# Patient Record
Sex: Male | Born: 1960 | Smoking: Former smoker
Health system: Southern US, Community
[De-identification: ages and names within clinical notes are randomized; demographics above are authoritative.]

## PROBLEM LIST (undated history)

## (undated) ENCOUNTER — Ambulatory Visit: Admission: EM | Discharge status: Absent without leave

## (undated) DIAGNOSIS — M545 Low back pain, unspecified: Secondary | ICD-10-CM

## (undated) DIAGNOSIS — H5789 Other specified disorders of eye and adnexa: Secondary | ICD-10-CM

## (undated) DIAGNOSIS — I10 Essential (primary) hypertension: Secondary | ICD-10-CM

## (undated) DIAGNOSIS — E785 Hyperlipidemia, unspecified: Secondary | ICD-10-CM

## (undated) HISTORY — DX: Essential (primary) hypertension: I10

## (undated) HISTORY — DX: Low back pain: M54.5

## (undated) HISTORY — DX: Other specified disorders of eye and adnexa: H57.89

## (undated) HISTORY — DX: Hyperlipidemia, unspecified: E78.5

## (undated) HISTORY — DX: Low back pain, unspecified: M54.50

---

## 2010-01-07 HISTORY — PX: OTHER SURGICAL HISTORY: SHX169

## 2010-09-05 LAB — HM COLONOSCOPY

## 2013-01-07 HISTORY — PX: INGUINAL HERNIA REPAIR: SHX194

## 2015-09-05 ENCOUNTER — Encounter: Payer: Self-pay | Admitting: Internal Medicine

## 2015-09-05 ENCOUNTER — Ambulatory Visit (INDEPENDENT_AMBULATORY_CARE_PROVIDER_SITE_OTHER): Admitting: Internal Medicine

## 2015-09-05 VITALS — BP 140/84 | HR 84 | Resp 16 | Ht 69.0 in | Wt 167.0 lb

## 2015-09-05 DIAGNOSIS — G43009 Migraine without aura, not intractable, without status migrainosus: Secondary | ICD-10-CM | POA: Diagnosis not present

## 2015-09-05 DIAGNOSIS — M4806 Spinal stenosis, lumbar region: Secondary | ICD-10-CM

## 2015-09-05 DIAGNOSIS — K219 Gastro-esophageal reflux disease without esophagitis: Secondary | ICD-10-CM | POA: Insufficient documentation

## 2015-09-05 DIAGNOSIS — H578 Other specified disorders of eye and adnexa: Secondary | ICD-10-CM

## 2015-09-05 DIAGNOSIS — I10 Essential (primary) hypertension: Secondary | ICD-10-CM | POA: Insufficient documentation

## 2015-09-05 DIAGNOSIS — M48061 Spinal stenosis, lumbar region without neurogenic claudication: Secondary | ICD-10-CM | POA: Insufficient documentation

## 2015-09-05 DIAGNOSIS — H5789 Other specified disorders of eye and adnexa: Secondary | ICD-10-CM

## 2015-09-05 DIAGNOSIS — Z800 Family history of malignant neoplasm of digestive organs: Secondary | ICD-10-CM | POA: Diagnosis not present

## 2015-09-05 HISTORY — DX: Other specified disorders of eye and adnexa: H57.89

## 2015-09-05 NOTE — Progress Notes (Signed)
Date:  09/05/2015   Name:  Robert Gilmore   DOB:  Feb 09, 1960   MRN:  [id number]  Previously being seen in [place], [place].  He was getting scripts from an UC/Primary care office.  They have referred him to [facility] clinic with an appointment in September.  Chief Complaint: Establish Care Hypertension  This is a chronic problem. The current episode started more than 1 year ago. The problem is unchanged. The problem is controlled. Associated symptoms include headaches. Pertinent negatives include no chest pain, neck pain, palpitations or shortness of breath. Past treatments include ACE inhibitors and diuretics. The current treatment provides significant improvement. 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  Back Pain  This is a chronic problem. The current episode started more than 1 year ago (since 2003 - no injury). The problem occurs constantly. The problem has been gradually worsening since onset. The pain is present in the lumbar spine. The quality of the pain is described as aching and burning. The pain radiates to the right knee and left knee. The pain is moderate. Associated symptoms include headaches, numbness and weakness. Pertinent negatives include no abdominal pain, chest pain or dysuria. He has tried NSAIDs and analgesics (currently taking Ultram ER) for the symptoms. The treatment provided moderate (but he has to take it every day) relief.  Migraine   This is a recurrent problem. The current episode started more than 1 year ago. The problem occurs seasonly (about twice a year). The pain is located in the right unilateral region. The quality of the pain is described as shooting and boring. Associated symptoms include back pain, eye redness (every AM), numbness, phonophobia, photophobia and weakness. Pertinent negatives include no abdominal pain, coughing, dizziness, hearing loss or neck pain. The symptoms are aggravated by emotional stress. Improvement on treatment: seen  by neurologist - prescribed PRN medications. His past medical history is significant for hypertension.  Gastroesophageal Reflux  He complains of heartburn and a hoarse voice. He reports no abdominal pain, no chest pain, no coughing or no wheezing. The problem occurs occasionally. Pertinent negatives include no fatigue. He has tried a PPI for the symptoms.  Red eyes - every am has red eyes with mild throbbing but no discharge, no change in vision.  Improves with Visine drops or as the day progresses.  He denies sleeping with a fan, new animal or other potential allergen source.  Seen by Ophthalmology in March with normal exam but did not mention this.    Review of Systems  Constitutional: Negative for appetite change, fatigue and unexpected weight change.  HENT: Positive for hoarse voice. Negative for hearing loss.   Eyes: Positive for photophobia and redness (every AM). Negative for visual disturbance.  Respiratory: Negative for cough, shortness of breath and wheezing.   Cardiovascular: Negative for chest pain, palpitations and leg swelling.  Gastrointestinal: Positive for heartburn. Negative for abdominal pain and blood in stool.  Endocrine: Negative for polydipsia and polyuria.  Genitourinary: Negative for difficulty urinating, dysuria and hematuria.  Musculoskeletal: Positive for arthralgias, back pain and myalgias. Negative for neck pain and neck stiffness.  Skin: Negative for color change and rash.  Neurological: Positive for weakness, numbness and headaches. Negative for dizziness and tremors.  Psychiatric/Behavioral: Positive for decreased concentration and sleep disturbance. Negative for dysphoric mood.    Patient Active Problem List   Diagnosis Date Noted  . Essential hypertension 09/05/2015  . Spinal stenosis at L4-L5 level 09/05/2015    Prior to Admission  medications   Medication Sig Start Date End Date Taking? Authorizing Provider  ALPRAZolam Duanne Moron) 0.5 MG tablet Take 0.5  mg by mouth at bedtime as needed for anxiety.   Yes Historical Provider, MD  lisinopril (PRINIVIL,ZESTRIL) 40 MG tablet Take 40 mg by mouth daily.   Yes Historical Provider, MD  traMADol (ULTRAM-ER) 200 MG 24 hr tablet Take 200 mg by mouth daily.   Yes Historical Provider, MD  traZODone (DESYREL) 50 MG tablet  06/13/15  Yes Historical Provider, MD  triamterene-hydrochlorothiazide (MAXZIDE) 75-50 MG tablet  06/20/15  Yes Historical Provider, MD    No Known Allergies  Past Surgical History:  Procedure Laterality Date  . colonoscopy  2012    Social History  Substance Use Topics  . Smoking status: Former Research scientist (life sciences)  . Smokeless tobacco: Never Used  . Alcohol use Not on file     Medication list has been reviewed and updated.   Physical Exam  Constitutional: He is oriented to person, place, and time. He appears well-developed. No distress.  HENT:  Head: Normocephalic and atraumatic.  Eyes: Conjunctivae and EOM are normal. Pupils are equal, round, and reactive to light. Right eye exhibits no chemosis. Left eye exhibits no chemosis.  Neck: Normal range of motion. Neck supple. Carotid bruit is not present.  Cardiovascular: Normal rate, regular rhythm and normal heart sounds.   Pulmonary/Chest: Effort normal and breath sounds normal. No respiratory distress. He has no wheezes. He has no rales.  Musculoskeletal: Normal range of motion. He exhibits no edema or tenderness.       Lumbar back: He exhibits normal range of motion, no tenderness and no bony tenderness.  Neurological: He is alert and oriented to person, place, and time. He has normal strength. No sensory deficit.  Reflex Scores:      Bicep reflexes are 1+ on the right side and 1+ on the left side.      Patellar reflexes are 1+ on the right side and 1+ on the left side. Skin: Skin is warm, dry and intact. No rash noted.  Psychiatric: He has a normal mood and affect. His speech is normal and behavior is normal. Thought content normal.    Nursing note and vitals reviewed.   BP 140/84 (BP Location: Right Arm, Patient Position: Sitting, Cuff Size: Normal)   Pulse 84   Resp 16   Ht 5\' 9"  (1.753 m)   Wt 167 lb (75.8 kg)   SpO2 97%   BMI 24.66 kg/m   Assessment and Plan: 1. Essential hypertension Controlled Schedule CPX in the near future  2. Spinal stenosis at L4-L5 level With chronic pain controlled fairly well with Ultram See [facility] pain clinic as planned  3. Gastroesophageal reflux disease without esophagitis Controlled by Nexium  4. Migraine without aura and without status migrainosus, not intractable Occasional episodes - has PRN medications to take  5. Red eyes Try allegra daily or follow up with Ophthalmology   Halina Maidens, MD [facility]  09/05/2015

## 2015-09-05 NOTE — Patient Instructions (Signed)
DASH Eating Plan  DASH stands for "Dietary Approaches to Stop Hypertension." The DASH eating plan is a healthy eating plan that has been shown to reduce high blood pressure (hypertension). Additional health benefits may include reducing the risk of type 2 diabetes mellitus, heart disease, and stroke. The DASH eating plan may also help with weight loss.  WHAT DO I NEED TO KNOW ABOUT THE DASH EATING PLAN?  For the DASH eating plan, you will follow these general guidelines:  · Choose foods with a percent daily value for sodium of less than 5% (as listed on the food label).  · Use salt-free seasonings or herbs instead of table salt or sea salt.  · Check with your health care provider or pharmacist before using salt substitutes.  · Eat lower-sodium products, often labeled as "lower sodium" or "no salt added."  · Eat fresh foods.  · Eat more vegetables, fruits, and low-fat dairy products.  · Choose whole grains. Look for the word "whole" as the first word in the ingredient list.  · Choose fish and skinless chicken or turkey more often than red meat. Limit fish, poultry, and meat to 6 oz (170 g) each day.  · Limit sweets, desserts, sugars, and sugary drinks.  · Choose heart-healthy fats.  · Limit cheese to 1 oz (28 g) per day.  · Eat more home-cooked food and less restaurant, buffet, and fast food.  · Limit fried foods.  · Cook foods using methods other than frying.  · Limit canned vegetables. If you do use them, rinse them well to decrease the sodium.  · When eating at a restaurant, ask that your food be prepared with less salt, or no salt if possible.  WHAT FOODS CAN I EAT?  Seek help from a dietitian for individual calorie needs.  Grains  Whole grain or whole wheat bread. Brown rice. Whole grain or whole wheat pasta. Quinoa, bulgur, and whole grain cereals. Low-sodium cereals. Corn or whole wheat flour tortillas. Whole grain cornbread. Whole grain crackers. Low-sodium crackers.  Vegetables  Fresh or frozen vegetables  (raw, steamed, roasted, or grilled). Low-sodium or reduced-sodium tomato and vegetable juices. Low-sodium or reduced-sodium tomato sauce and paste. Low-sodium or reduced-sodium canned vegetables.   Fruits  All fresh, canned (in natural juice), or frozen fruits.  Meat and Other Protein Products  Ground beef (85% or leaner), grass-fed beef, or beef trimmed of fat. Skinless chicken or turkey. Ground chicken or turkey. Pork trimmed of fat. All fish and seafood. Eggs. Dried beans, peas, or lentils. Unsalted nuts and seeds. Unsalted canned beans.  Dairy  Low-fat dairy products, such as skim or 1% milk, 2% or reduced-fat cheeses, low-fat ricotta or cottage cheese, or plain low-fat yogurt. Low-sodium or reduced-sodium cheeses.  Fats and Oils  Tub margarines without trans fats. Light or reduced-fat mayonnaise and salad dressings (reduced sodium). Avocado. Safflower, olive, or canola oils. Natural peanut or almond butter.  Other  Unsalted popcorn and pretzels.  The items listed above may not be a complete list of recommended foods or beverages. Contact your dietitian for more options.  WHAT FOODS ARE NOT RECOMMENDED?  Grains  White bread. White pasta. White rice. Refined cornbread. Bagels and croissants. Crackers that contain trans fat.  Vegetables  Creamed or fried vegetables. Vegetables in a cheese sauce. Regular canned vegetables. Regular canned tomato sauce and paste. Regular tomato and vegetable juices.  Fruits  Dried fruits. Canned fruit in light or heavy syrup. Fruit juice.  Meat and Other Protein   Products  Fatty cuts of meat. Ribs, chicken wings, bacon, sausage, bologna, salami, chitterlings, fatback, hot dogs, bratwurst, and packaged luncheon meats. Salted nuts and seeds. Canned beans with salt.  Dairy  Whole or 2% milk, cream, half-and-half, and cream cheese. Whole-fat or sweetened yogurt. Full-fat cheeses or blue cheese. Nondairy creamers and whipped toppings. Processed cheese, cheese spreads, or cheese  curds.  Condiments  Onion and garlic salt, seasoned salt, table salt, and sea salt. Canned and packaged gravies. Worcestershire sauce. Tartar sauce. Barbecue sauce. Teriyaki sauce. Soy sauce, including reduced sodium. Steak sauce. Fish sauce. Oyster sauce. Cocktail sauce. Horseradish. Ketchup and mustard. Meat flavorings and tenderizers. Bouillon cubes. Hot sauce. Tabasco sauce. Marinades. Taco seasonings. Relishes.  Fats and Oils  Butter, stick margarine, lard, shortening, ghee, and bacon fat. Coconut, palm kernel, or palm oils. Regular salad dressings.  Other  Pickles and olives. Salted popcorn and pretzels.  The items listed above may not be a complete list of foods and beverages to avoid. Contact your dietitian for more information.  WHERE CAN I FIND MORE INFORMATION?  National Heart, Lung, and Blood Institute: www.nhlbi.nih.gov/health/health-topics/topics/dash/     This information is not intended to replace advice given to you by your health care provider. Make sure you discuss any questions you have with your health care provider.     Document Released: 12/13/2010 Document Revised: 01/14/2014 Document Reviewed: 10/28/2012  Elsevier Interactive Patient Education ©2016 Elsevier Inc.

## 2015-10-03 ENCOUNTER — Encounter: Payer: Self-pay | Admitting: Internal Medicine

## 2015-10-03 ENCOUNTER — Ambulatory Visit
Admission: RE | Admit: 2015-10-03 | Discharge: 2015-10-03 | Disposition: A | Discharge status: Home / Self Care | Source: Ambulatory Visit | Attending: Internal Medicine | Admitting: Internal Medicine

## 2015-10-03 ENCOUNTER — Ambulatory Visit (INDEPENDENT_AMBULATORY_CARE_PROVIDER_SITE_OTHER): Admitting: Internal Medicine

## 2015-10-03 VITALS — BP 136/82 | HR 83 | Resp 16 | Ht 69.0 in | Wt 173.0 lb

## 2015-10-03 DIAGNOSIS — G2581 Restless legs syndrome: Secondary | ICD-10-CM

## 2015-10-03 DIAGNOSIS — Z0000 Encounter for general adult medical examination without abnormal findings: Secondary | ICD-10-CM

## 2015-10-03 DIAGNOSIS — M4186 Other forms of scoliosis, lumbar region: Secondary | ICD-10-CM | POA: Insufficient documentation

## 2015-10-03 DIAGNOSIS — M4806 Spinal stenosis, lumbar region: Secondary | ICD-10-CM

## 2015-10-03 DIAGNOSIS — I10 Essential (primary) hypertension: Secondary | ICD-10-CM | POA: Diagnosis not present

## 2015-10-03 DIAGNOSIS — M5136 Other intervertebral disc degeneration, lumbar region: Secondary | ICD-10-CM | POA: Diagnosis not present

## 2015-10-03 DIAGNOSIS — R0781 Pleurodynia: Secondary | ICD-10-CM | POA: Diagnosis not present

## 2015-10-03 DIAGNOSIS — K219 Gastro-esophageal reflux disease without esophagitis: Secondary | ICD-10-CM | POA: Diagnosis not present

## 2015-10-03 DIAGNOSIS — M48061 Spinal stenosis, lumbar region without neurogenic claudication: Secondary | ICD-10-CM

## 2015-10-03 DIAGNOSIS — Z125 Encounter for screening for malignant neoplasm of prostate: Secondary | ICD-10-CM | POA: Diagnosis not present

## 2015-10-03 LAB — POCT URINALYSIS DIPSTICK
BILIRUBIN UA: NEGATIVE
Blood, UA: NEGATIVE
Glucose, UA: NEGATIVE
Ketones, UA: NEGATIVE
LEUKOCYTES UA: NEGATIVE
NITRITE UA: NEGATIVE
PH UA: 6
PROTEIN UA: NEGATIVE
Spec Grav, UA: 1.02
Urobilinogen, UA: 0.2

## 2015-10-03 MED ORDER — CLONAZEPAM 0.5 MG PO TABS: 0.5000 mg | ORAL_TABLET | Freq: Every day | ORAL | 1 refills | Status: DC

## 2015-10-03 MED ORDER — CLONAZEPAM 0.5 MG PO TABS: 0.5000 mg | ORAL_TABLET | Freq: Every day | ORAL | 0 refills | Status: DC

## 2015-10-03 NOTE — Progress Notes (Signed)
Date:  10/03/2015   Name:  Robert Gilmore   DOB:  24-Mar-1960   MRN:  [id number]   Chief Complaint: Annual Exam; Rib Injury (Rib pain on Right side); and Insomnia Robert Gilmore is a 55 y.o. male who presents today for his Complete Annual Exam. He feels fairly well. He reports exercising regularly. He reports he is sleeping poorly.   Insomnia  Primary symptoms: sleep disturbance, no difficulty falling asleep, frequent awakening, premature morning awakening.  The onset quality is undetermined. The symptoms are aggravated by other stimulant use. Past treatments include medication (trazodone causing head stuffiness). The treatment provided mild relief. PMH includes: restless leg syndrome (has taken clonazepam in the past).  Hypertension  This is a chronic problem. The current episode started more than 1 year ago. The problem is unchanged. The problem is controlled. Pertinent negatives include no chest pain, headaches, palpitations or shortness of breath.  Back Pain  This is a chronic problem. The problem has been gradually worsening since onset. The pain is present in the lumbar spine. Pertinent negatives include no abdominal pain, chest pain, dysuria or headaches. He has tried analgesics, muscle relaxant and NSAIDs (currently seeing pain management at [place] next month) for the symptoms. The treatment provided moderate relief.   Rib pain - onset about 6 mo ago with no injury.  Pain in the lower ant right ribs - feels like his ribs are rubbing against his pelvic bone.  It hurts more when sitting.  Better with standing or lying down.  He tends to twist in his desk at work.  It improves as well with advil or prednisone (took a recent taper for his back pain).   Review of Systems  Constitutional: Negative for appetite change, chills, diaphoresis, fatigue and unexpected weight change.  HENT: Negative for hearing loss, tinnitus, trouble swallowing and voice change.   Eyes: Negative for visual  disturbance.  Respiratory: Negative for choking, shortness of breath and wheezing.   Cardiovascular: Negative for chest pain, palpitations and leg swelling.  Gastrointestinal: Negative for abdominal pain, blood in stool, constipation and diarrhea.  Endocrine: Negative for polydipsia and polyuria.  Genitourinary: Negative for difficulty urinating, dysuria and frequency.  Musculoskeletal: Positive for back pain. Negative for arthralgias and myalgias.  Skin: Negative for color change and rash.  Allergic/Immunologic: Negative for environmental allergies.  Neurological: Negative for dizziness, tremors, seizures, syncope and headaches.       Restless leg sx 4 nights per week  Hematological: Negative for adenopathy.  Psychiatric/Behavioral: Positive for sleep disturbance. Negative for dysphoric mood. The patient is nervous/anxious and has insomnia.     Patient Active Problem List   Diagnosis Date Noted  . Essential hypertension 09/05/2015  . Spinal stenosis at L4-L5 level 09/05/2015  . Family history of colon cancer 09/05/2015  . Gastroesophageal reflux disease without esophagitis 09/05/2015  . Migraine without aura and without status migrainosus, not intractable 09/05/2015  . Red eyes 09/05/2015    Prior to Admission medications   Medication Sig Start Date End Date Taking? Authorizing Provider  triamterene-hydrochlorothiazide (MAXZIDE) 75-50 MG tablet  06/20/15  Yes Historical Provider, MD  ALPRAZolam Duanne Moron) 0.5 MG tablet Take by mouth.    Historical Provider, MD  B Complex Vitamins (VITAMIN-B COMPLEX) TABS Take by mouth.    Historical Provider, MD  b complex vitamins tablet Take 1 tablet by mouth daily.    Historical Provider, MD  esomeprazole (NEXIUM) 40 MG capsule Take 40 mg by mouth daily at  12 noon.    Historical Provider, MD  esomeprazole (NEXIUM) 40 MG capsule Take by mouth.    Historical Provider, MD  lisinopril (PRINIVIL,ZESTRIL) 40 MG tablet Take 40 mg by mouth daily.     Historical Provider, MD  lisinopril (PRINIVIL,ZESTRIL) 40 MG tablet Take by mouth.    Historical Provider, MD  Multiple Vitamins-Minerals (MULTIVITAMIN WITH MINERALS) tablet Take 1 tablet by mouth daily.    Historical Provider, MD  Multiple Vitamins-Minerals (MULTIVITAMIN WITH MINERALS) tablet Take by mouth.    Historical Provider, MD  traMADol (ULTRAM-ER) 200 MG 24 hr tablet Take by mouth.    Historical Provider, MD  traZODone (DESYREL) 50 MG tablet  06/13/15   Historical Provider, MD  triamterene-hydrochlorothiazide (MAXZIDE) 75-50 MG tablet  06/20/15   Historical Provider, MD    No Known Allergies  Past Surgical History:  Procedure Laterality Date  . colonoscopy  2012    Social History  Substance Use Topics  . Smoking status: Former Research scientist (life sciences)  . Smokeless tobacco: Never Used  . Alcohol use Not on file     Medication list has been reviewed and updated.   Physical Exam  Constitutional: He is oriented to person, place, and time. He appears well-developed and well-nourished.  HENT:  Head: Normocephalic.  Right Ear: Tympanic membrane, external ear and ear canal normal.  Left Ear: Tympanic membrane, external ear and ear canal normal.  Nose: Nose normal.  Mouth/Throat: Uvula is midline and oropharynx is clear and moist.  Eyes: Conjunctivae and EOM are normal. Pupils are equal, round, and reactive to light.  Neck: Normal range of motion. Neck supple. Carotid bruit is not present. No thyromegaly present.  Cardiovascular: Normal rate, regular rhythm, normal heart sounds and intact distal pulses.   Pulmonary/Chest: Effort normal and breath sounds normal. He has no wheezes. Right breast exhibits no mass. Left breast exhibits no mass.  Tender over anterior edge of 11th and 12th ribs on right  Abdominal: Soft. Normal appearance and bowel sounds are normal. There is no hepatosplenomegaly. There is no tenderness. There is no CVA tenderness.  Lymphadenopathy:    He has no cervical adenopathy.    Neurological: He is alert and oriented to person, place, and time. He has normal reflexes.  Skin: Skin is warm, dry and intact.  Psychiatric: He has a normal mood and affect. His speech is normal and behavior is normal. Judgment and thought content normal. Cognition and memory are normal.  Nursing note and vitals reviewed.   BP 136/82   Pulse 83   Resp 16   Ht 5\' 9"  (1.753 m)   Wt 173 lb (78.5 kg)   SpO2 97%   BMI 25.55 kg/m   Assessment and Plan: 1. Annual physical exam Declines flu vaccine today - Lipid panel  2. Essential hypertension controlled - Comprehensive metabolic panel - POCT urinalysis dipstick  3. Gastroesophageal reflux disease without esophagitis Stable sx - CBC with Differential/Platelet  4. Spinal stenosis at L4-L5 level Being seen at [facility]  5. Restless leg syndrome Add clonazepam at hs for sx and sleep - clonazePAM (KLONOPIN) 0.5 MG tablet; Take 1 tablet (0.5 mg total) by mouth at bedtime.  Dispense: 90 tablet; Refill: 1  6. Rib pain on right side xrays to rule out bone pathology Avoid twisting at work - consider standing desk Take advil PRN - DG Ribs Unilateral Right; Future  7. Prostate cancer screening DRE declined - PSA   Halina Maidens, MD [facility]  10/03/2015

## 2015-10-04 LAB — CBC WITH DIFFERENTIAL/PLATELET
BASOS: 0 %
Basophils Absolute: 0 10*3/uL (ref 0.0–0.2)
EOS (ABSOLUTE): 0.1 10*3/uL (ref 0.0–0.4)
EOS: 2 %
HEMATOCRIT: 45.1 % (ref 37.5–51.0)
Hemoglobin: 15.5 g/dL (ref 12.6–17.7)
IMMATURE GRANS (ABS): 0 10*3/uL (ref 0.0–0.1)
IMMATURE GRANULOCYTES: 0 %
LYMPHS: 30 %
Lymphocytes Absolute: 2.5 10*3/uL (ref 0.7–3.1)
MCH: 29.9 pg (ref 26.6–33.0)
MCHC: 34.4 g/dL (ref 31.5–35.7)
MCV: 87 fL (ref 79–97)
MONOCYTES: 9 %
MONOS ABS: 0.7 10*3/uL (ref 0.1–0.9)
NEUTROS PCT: 59 %
Neutrophils Absolute: 4.8 10*3/uL (ref 1.4–7.0)
PLATELETS: 248 10*3/uL (ref 150–379)
RBC: 5.18 x10E6/uL (ref 4.14–5.80)
RDW: 13.5 % (ref 12.3–15.4)
WBC: 8.2 10*3/uL (ref 3.4–10.8)

## 2015-10-04 LAB — COMPREHENSIVE METABOLIC PANEL
ALT: 55 IU/L — AB (ref 0–44)
AST: 30 IU/L (ref 0–40)
Albumin/Globulin Ratio: 1.8 (ref 1.2–2.2)
Albumin: 4.2 g/dL (ref 3.5–5.5)
Alkaline Phosphatase: 100 IU/L (ref 39–117)
BUN/Creatinine Ratio: 14 (ref 9–20)
BUN: 13 mg/dL (ref 6–24)
Bilirubin Total: 0.3 mg/dL (ref 0.0–1.2)
CALCIUM: 9.3 mg/dL (ref 8.7–10.2)
CO2: 31 mmol/L — AB (ref 18–29)
CREATININE: 0.9 mg/dL (ref 0.76–1.27)
Chloride: 98 mmol/L (ref 96–106)
GFR, EST AFRICAN AMERICAN: 111 mL/min/{1.73_m2} (ref >59)
GFR, EST NON AFRICAN AMERICAN: 96 mL/min/{1.73_m2} (ref >59)
GLOBULIN, TOTAL: 2.4 g/dL (ref 1.5–4.5)
Glucose: 117 mg/dL — ABNORMAL HIGH (ref 65–99)
Potassium: 4 mmol/L (ref 3.5–5.2)
Sodium: 142 mmol/L (ref 134–144)
TOTAL PROTEIN: 6.6 g/dL (ref 6.0–8.5)

## 2015-10-04 LAB — LIPID PANEL
CHOL/HDL RATIO: 4.8 ratio (ref 0.0–5.0)
Cholesterol, Total: 238 mg/dL — ABNORMAL HIGH (ref 100–199)
HDL: 50 mg/dL (ref >39)
LDL CALC: 138 mg/dL — AB (ref 0–99)
TRIGLYCERIDES: 252 mg/dL — AB (ref 0–149)
VLDL Cholesterol Cal: 50 mg/dL — ABNORMAL HIGH (ref 5–40)

## 2015-10-04 LAB — PSA: PROSTATE SPECIFIC AG, SERUM: 2 ng/mL (ref 0.0–4.0)

## 2015-10-13 LAB — HGB A1C W/O EAG: HEMOGLOBIN A1C: 5.5 % (ref 4.8–5.6)

## 2015-10-13 LAB — SPECIMEN STATUS REPORT

## 2016-01-29 ENCOUNTER — Ambulatory Visit: Admitting: Internal Medicine

## 2016-02-01 ENCOUNTER — Encounter: Payer: Self-pay | Admitting: Internal Medicine

## 2016-02-01 ENCOUNTER — Ambulatory Visit (INDEPENDENT_AMBULATORY_CARE_PROVIDER_SITE_OTHER): Admitting: Internal Medicine

## 2016-02-01 VITALS — BP 148/108 | HR 98 | Temp 98.3°F | Ht 69.0 in | Wt 166.0 lb

## 2016-02-01 DIAGNOSIS — I10 Essential (primary) hypertension: Secondary | ICD-10-CM

## 2016-02-01 DIAGNOSIS — I8002 Phlebitis and thrombophlebitis of superficial vessels of left lower extremity: Secondary | ICD-10-CM | POA: Diagnosis not present

## 2016-02-01 DIAGNOSIS — S86912A Strain of unspecified muscle(s) and tendon(s) at lower leg level, left leg, initial encounter: Secondary | ICD-10-CM | POA: Diagnosis not present

## 2016-02-01 NOTE — Progress Notes (Signed)
Date:  02/01/2016   Name:  Brailyn Topel   DOB:  October 24, 1960   MRN:  [id number]   Chief Complaint: DVT  Leg Pain   The incident occurred more than 1 week ago. Injury mechanism: hyperextension. The pain is present in the left leg. The pain is mild. The pain has been fluctuating since onset. He has tried non-weight bearing, NSAIDs and elevation for the symptoms. The treatment provided mild relief.  Thrombophlebitis - seen about a month ago at ER in [place] for swelling in left leg.  [place] - superficial blood clot and treated with aspirin.  Swelling better but still comes and goes. HTN - has been controlled but did not take his medication yet today.  He feels well - no chest pain or headache.  Review of Systems  Constitutional: Negative for chills, fatigue and fever.  Respiratory: Negative for chest tightness and shortness of breath.   Cardiovascular: Positive for leg swelling. Negative for chest pain and palpitations.  Musculoskeletal: Positive for arthralgias, back pain and gait problem.    Patient Active Problem List   Diagnosis Date Noted  . Restless leg syndrome 10/03/2015  . Rib pain on right side 10/03/2015  . Essential hypertension 09/05/2015  . Spinal stenosis at L4-L5 level 09/05/2015  . Family history of colon cancer 09/05/2015  . Gastroesophageal reflux disease without esophagitis 09/05/2015  . Migraine without aura and without status migrainosus, not intractable 09/05/2015  . Red eyes 09/05/2015    Prior to Admission medications   Medication Sig Start Date End Date Taking? Authorizing Provider  ALPRAZolam Duanne Moron) 0.5 MG tablet Take by mouth.   Yes Historical Provider, MD  ASPIRIN 81 PO Take by mouth.   Yes Historical Provider, MD  B Complex Vitamins (VITAMIN-B COMPLEX) TABS Take by mouth.   Yes Historical Provider, MD  clonazePAM (KLONOPIN) 0.5 MG tablet Take 1 tablet (0.5 mg total) by mouth at bedtime. 10/03/15  Yes Glean Hess, MD  esomeprazole (NEXIUM) 40 MG capsule  Take 40 mg by mouth daily at 12 noon.   Yes Historical Provider, MD  lisinopril (PRINIVIL,ZESTRIL) 40 MG tablet Take 40 mg by mouth daily.   Yes Historical Provider, MD  Multiple Vitamins-Minerals (MULTIVITAMIN WITH MINERALS) tablet Take 1 tablet by mouth daily.   Yes Historical Provider, MD  traMADol (ULTRAM-ER) 200 MG 24 hr tablet Take by mouth.   Yes Historical Provider, MD  traZODone (DESYREL) 50 MG tablet  06/13/15  Yes Historical Provider, MD  triamterene-hydrochlorothiazide (MAXZIDE) 75-50 MG tablet  06/20/15  Yes Historical Provider, MD    No Known Allergies  Past Surgical History:  Procedure Laterality Date  . colonoscopy  2012    Social History  Substance Use Topics  . Smoking status: Former Research scientist (life sciences)  . Smokeless tobacco: Never Used  . Alcohol use Not on file     Medication list has been reviewed and updated.   Physical Exam  Constitutional: He is oriented to person, place, and time. He appears well-developed. No distress.  HENT:  Head: Normocephalic and atraumatic.  Cardiovascular: Normal rate, regular rhythm and normal heart sounds.   Pulmonary/Chest: Effort normal and breath sounds normal. No respiratory distress.  Musculoskeletal:       Right shoulder: He exhibits decreased range of motion and tenderness. He exhibits no bony tenderness, no effusion, no deformity and normal pulse.       Left knee: He exhibits decreased range of motion. He exhibits no swelling and no effusion. No tenderness found.  Legs: Neurological: He is alert and oriented to person, place, and time.  Skin: Skin is warm and dry. No rash noted.  Psychiatric: He has a normal mood and affect. His behavior is normal. Thought content normal.  Nursing note and vitals reviewed.   BP (!) 148/108   Pulse 98   Temp 98.3 F (36.8 C)   Ht 5\' 9"  (1.753 m)   Wt 166 lb (75.3 kg)   SpO2 98%   BMI 24.51 kg/m   Assessment and Plan: 1. Thrombophlebitis of superficial veins of left lower  extremity Continue heat, ibuprofen and elevation Continue aspirin 81 mg  2. Essential hypertension Continue medication Follow up if persistently elevated  3. Knee strain, left, initial encounter Needs Ortho evaluation - Ambulatory referral to Orthopedic Surgery   Halina Maidens, MD [facility]  02/01/2016

## 2016-02-23 ENCOUNTER — Encounter: Payer: Self-pay | Admitting: Internal Medicine

## 2016-02-23 ENCOUNTER — Ambulatory Visit (INDEPENDENT_AMBULATORY_CARE_PROVIDER_SITE_OTHER): Admitting: Internal Medicine

## 2016-02-23 VITALS — BP 136/82 | HR 105 | Temp 97.8°F | Ht 69.0 in | Wt 160.0 lb

## 2016-02-23 DIAGNOSIS — M48061 Spinal stenosis, lumbar region without neurogenic claudication: Secondary | ICD-10-CM | POA: Diagnosis not present

## 2016-02-23 DIAGNOSIS — I10 Essential (primary) hypertension: Secondary | ICD-10-CM

## 2016-02-23 NOTE — Progress Notes (Signed)
Date:  02/23/2016   Name:  Robert Gilmore   DOB:  12/21/1960   MRN:  [id number]   Chief Complaint: No chief complaint on file. Back Pain  This is a chronic problem. The current episode started more than 1 year ago. The problem has been waxing and waning since onset. The pain is present in the lumbar spine. The pain is moderate. Associated symptoms include numbness. Pertinent negatives include no chest pain, fever or weakness.  He has been getting his Tramadol from [facility] but they can no longer prescribe it. He was seen by [facility] - he had the full assessment, psychological evaluation, etc and was told that he was "low risk".  Therefore, they will not take him on as a patient.  He needs to find another pain center to prescribe.  He has been through [facility] (no benefit), TENS and is now doing acu-puncture.  He feels that acu-puncture is helping.   He would ultimately like to come off of tramadol. I explained that I could prescribe Tramadol as long as we were not titrating or giving meds for breakthrough pain.  His current regimen is Tramadol ER 200 mg daily with #15 tramadol 50mg  per month for breakthrough.    Review of Systems  Constitutional: Negative for chills, fatigue and fever.  Respiratory: Negative for chest tightness, shortness of breath and wheezing.   Cardiovascular: Negative for chest pain and palpitations.  Genitourinary: Negative for difficulty urinating.  Musculoskeletal: Positive for back pain. Negative for gait problem.  Neurological: Positive for numbness. Negative for syncope and weakness.    Patient Active Problem List   Diagnosis Date Noted  . Restless leg syndrome 10/03/2015  . Rib pain on right side 10/03/2015  . Essential hypertension 09/05/2015  . Spinal stenosis at L4-L5 level 09/05/2015  . Family history of colon cancer 09/05/2015  . Gastroesophageal reflux disease without esophagitis 09/05/2015  . Migraine without aura and without status  migrainosus, not intractable 09/05/2015  . Red eyes 09/05/2015    Prior to Admission medications   Medication Sig Start Date End Date Taking? Authorizing Provider  ALPRAZolam Duanne Moron) 0.5 MG tablet Take by mouth.   Yes Historical Provider, MD  ASPIRIN 81 PO Take by mouth.   Yes Historical Provider, MD  B Complex Vitamins (VITAMIN-B COMPLEX) TABS Take by mouth.   Yes Historical Provider, MD  clonazePAM (KLONOPIN) 0.5 MG tablet Take 1 tablet (0.5 mg total) by mouth at bedtime. 10/03/15  Yes Glean Hess, MD  esomeprazole (NEXIUM) 40 MG capsule Take 40 mg by mouth daily at 12 noon.   Yes Historical Provider, MD  lisinopril (PRINIVIL,ZESTRIL) 40 MG tablet Take 40 mg by mouth daily.   Yes Historical Provider, MD  meloxicam (MOBIC) 15 MG tablet TAKE 1 TABLET BY MOUTH EVERY DAY 02/08/16  Yes Historical Provider, MD  Multiple Vitamins-Minerals (MULTIVITAMIN WITH MINERALS) tablet Take 1 tablet by mouth daily.   Yes Historical Provider, MD  nortriptyline (PAMELOR) 50 MG capsule Take by mouth. 02/16/16  Yes Historical Provider, MD  traMADol (ULTRAM-ER) 200 MG 24 hr tablet Take by mouth.   Yes Historical Provider, MD  traZODone (DESYREL) 50 MG tablet  06/13/15  Yes Historical Provider, MD  triamterene-hydrochlorothiazide (MAXZIDE) 75-50 MG tablet  06/20/15  Yes Historical Provider, MD  gabapentin (NEURONTIN) 300 MG capsule Take by mouth. 02/16/16   Historical Provider, MD    No Known Allergies  Past Surgical History:  Procedure Laterality Date  . colonoscopy  2012    Social History  Substance Use Topics  . Smoking status: Former Research scientist (life sciences)  . Smokeless tobacco: Never Used  . Alcohol use Not on file    Medication list has been reviewed and updated.   Physical Exam  Constitutional: He is oriented to person, place, and time. He appears well-developed. No distress.  HENT:  Head: Normocephalic and atraumatic.  Neck: Normal range of motion. Neck supple.  Cardiovascular: Normal rate, regular rhythm and  normal heart sounds.   Pulmonary/Chest: Effort normal and breath sounds normal. No respiratory distress. He has no wheezes.  Neurological: He is alert and oriented to person, place, and time.  Skin: Skin is warm and dry. No rash noted.  Psychiatric: He has a normal mood and affect. His behavior is normal. Thought content normal.  Nursing note and vitals reviewed.   BP 136/82   Pulse (!) 105   Temp 97.8 F (36.6 C)   Ht 5\' 9"  (1.753 m)   Wt 160 lb (72.6 kg)   SpO2 96%   BMI 23.63 kg/m   Assessment and Plan: 1. Spinal stenosis at L4-L5 level Continue current medication Continue Acupuncture Return in one month if an Rx of Tramadol is needed to bridge to new pain clinic appt - Ambulatory referral to Pain Clinic  2. Essential hypertension controlled   Halina Maidens, MD [facility]  02/23/2016

## 2016-03-11 ENCOUNTER — Other Ambulatory Visit: Payer: Self-pay | Admitting: Internal Medicine

## 2016-03-11 DIAGNOSIS — G2581 Restless legs syndrome: Secondary | ICD-10-CM

## 2016-03-11 MED ORDER — CLONAZEPAM 0.5 MG PO TABS: 0.5000 mg | ORAL_TABLET | Freq: Every day | ORAL | 1 refills | Status: DC

## 2016-03-18 ENCOUNTER — Ambulatory Visit: Admitting: Internal Medicine

## 2016-03-21 ENCOUNTER — Encounter: Payer: Self-pay | Admitting: Internal Medicine

## 2016-03-21 ENCOUNTER — Ambulatory Visit (INDEPENDENT_AMBULATORY_CARE_PROVIDER_SITE_OTHER): Admitting: Internal Medicine

## 2016-03-21 VITALS — BP 136/86 | HR 96 | Ht 69.0 in | Wt 164.0 lb

## 2016-03-21 DIAGNOSIS — I10 Essential (primary) hypertension: Secondary | ICD-10-CM

## 2016-03-21 DIAGNOSIS — M48061 Spinal stenosis, lumbar region without neurogenic claudication: Secondary | ICD-10-CM

## 2016-03-21 MED ORDER — TRAMADOL HCL 50 MG PO TABS: 50.0000 mg | ORAL_TABLET | Freq: Four times a day (QID) | ORAL | 0 refills | Status: AC

## 2016-03-21 NOTE — Progress Notes (Signed)
Date:  03/21/2016   Name:  Robert Gilmore   DOB:  04/13/1960   MRN:  [id number]   Chief Complaint: Hypertension (Needs meds refilled.) Hypertension  This is a chronic problem. The problem is controlled. Pertinent negatives include no blurred vision, chest pain, headaches, palpitations or shortness of breath.  Back Pain  This is a recurrent problem. The problem occurs daily. The problem has been gradually improving (with accupuncture) since onset. Associated symptoms include numbness. Pertinent negatives include no chest pain, fever, headaches or weakness.  He has been on Tramadol ER 200 mg per day.  He is seeing Accupuncture with Dr. Hampton Abbot.  Dr. Hampton Abbot also prescribed gabapentin and Pamelor.    The pain clinic in [place] agrees to see him back if he is unsuccessful in tapering and discontinuing Tramadol.  He would like me to write one last Rx to taper off.  Review of Systems  Constitutional: Negative for chills, fatigue and fever.  Eyes: Negative for blurred vision.  Respiratory: Negative for chest tightness, shortness of breath and wheezing.   Cardiovascular: Negative for chest pain and palpitations.  Genitourinary: Negative for difficulty urinating.  Musculoskeletal: Positive for back pain. Negative for gait problem.  Neurological: Positive for numbness. Negative for syncope, weakness and headaches.    Patient Active Problem List   Diagnosis Date Noted  . Restless leg syndrome 10/03/2015  . Rib pain on right side 10/03/2015  . Essential hypertension 09/05/2015  . Spinal stenosis at L4-L5 level 09/05/2015  . Family history of colon cancer 09/05/2015  . Gastroesophageal reflux disease without esophagitis 09/05/2015  . Migraine without aura and without status migrainosus, not intractable 09/05/2015  . Red eyes 09/05/2015    Prior to Admission medications   Medication Sig Start Date End Date Taking? Authorizing Provider  ALPRAZolam Duanne Moron) 0.5 MG tablet Take by mouth.   Yes Historical  Provider, MD  ASPIRIN 81 PO Take by mouth.   Yes Historical Provider, MD  B Complex Vitamins (VITAMIN-B COMPLEX) TABS Take by mouth.   Yes Historical Provider, MD  clonazePAM (KLONOPIN) 0.5 MG tablet Take 1 tablet (0.5 mg total) by mouth at bedtime. 03/11/16  Yes Glean Hess, MD  esomeprazole (NEXIUM) 40 MG capsule Take 40 mg by mouth daily at 12 noon.   Yes Historical Provider, MD  gabapentin (NEURONTIN) 300 MG capsule Take by mouth. 02/16/16  Yes Historical Provider, MD  lisinopril (PRINIVIL,ZESTRIL) 40 MG tablet Take 40 mg by mouth daily.   Yes Historical Provider, MD  meloxicam (MOBIC) 15 MG tablet TAKE 1 TABLET BY MOUTH EVERY DAY 02/08/16  Yes Historical Provider, MD  Multiple Vitamins-Minerals (MULTIVITAMIN WITH MINERALS) tablet Take 1 tablet by mouth daily.   Yes Historical Provider, MD  nortriptyline (PAMELOR) 50 MG capsule Take by mouth. 02/16/16  Yes Historical Provider, MD  traMADol (ULTRAM-ER) 200 MG 24 hr tablet Take by mouth.   Yes Historical Provider, MD  triamterene-hydrochlorothiazide (MAXZIDE) 75-50 MG tablet  06/20/15  Yes Historical Provider, MD    No Known Allergies  Past Surgical History:  Procedure Laterality Date  . colonoscopy  2012    Social History  Substance Use Topics  . Smoking status: Former Research scientist (life sciences)  . Smokeless tobacco: Never Used  . Alcohol use Not on file     Medication list has been reviewed and updated.   Physical Exam  Constitutional: He is oriented to person, place, and time. He appears well-developed. He appears distressed (appears mildly uncomfortable sitting on exam table).  HENT:  Head: Normocephalic and atraumatic.  Neck: Normal range of motion. Neck supple.  Cardiovascular: Normal rate, regular rhythm and normal heart sounds.   Pulmonary/Chest: Effort normal and breath sounds normal. No respiratory distress. He has no wheezes.  Musculoskeletal: He exhibits no edema.  Neurological: He is alert and oriented to person, place, and time.    Skin: Skin is warm and dry. No rash noted.  Psychiatric: He has a normal mood and affect. His behavior is normal. Thought content normal.  Nursing note and vitals reviewed.   BP (!) 138/92 (BP Location: Right Arm, Patient Position: Sitting, Cuff Size: Normal)   Pulse 96   Ht 5\' 9"  (1.753 m)   Wt 164 lb (74.4 kg)   SpO2 98%   BMI 24.22 kg/m   Assessment and Plan: 1. Spinal stenosis at L4-L5 level Taper Tramadol as instructed - decrease by one tab per day every 2 weeks until stopped.  #120 tabs Continue Gabapentin   2. Essential hypertension Controlled fairly well considering chronic pain   Meds ordered this encounter  Medications  . traMADol (ULTRAM) 50 MG tablet    Sig: Take 1 tablet (50 mg total) by mouth 4 (four) times daily.    Dispense:  120 tablet    Refill:  0    Halina Maidens, MD [facility]  03/21/2016

## 2016-05-02 ENCOUNTER — Encounter: Payer: Self-pay | Admitting: Internal Medicine

## 2016-05-02 ENCOUNTER — Ambulatory Visit (INDEPENDENT_AMBULATORY_CARE_PROVIDER_SITE_OTHER): Admitting: Internal Medicine

## 2016-05-02 VITALS — BP 120/82 | HR 85 | Ht 69.0 in | Wt 164.0 lb

## 2016-05-02 DIAGNOSIS — I10 Essential (primary) hypertension: Secondary | ICD-10-CM | POA: Diagnosis not present

## 2016-05-02 DIAGNOSIS — M48061 Spinal stenosis, lumbar region without neurogenic claudication: Secondary | ICD-10-CM

## 2016-05-02 NOTE — Progress Notes (Signed)
Date:  05/02/2016   Name:  Robert Gilmore   DOB:  [birth year]   MRN:  [id number]   Chief Complaint: Hypertension (Follow up- Wants to discuss tramadol as well. ) Hypertension  This is a chronic problem. The problem is unchanged. The problem is controlled. Pertinent negatives include no chest pain, headaches, palpitations or shortness of breath.  Back Pain  Pertinent negatives include no chest pain, fever or headaches.  He was unable to taper tramadol down lower than 150 mg per day.  He went back to pain clinic/urgent care in [place].  She prescribed tramadol ER 100 mg once a day and recommended 25 mg 2-3 times per day as needed with plan to try to slowly taper.  She also wanted to refer him to Neurosurgery at [facility] but needs it from his PCP. He continues to take Mobic, gabapentin and pamelor.  Review of Systems  Constitutional: Negative for chills, fatigue and fever.  Respiratory: Negative for chest tightness, shortness of breath and wheezing.   Cardiovascular: Negative for chest pain, palpitations and leg swelling.  Musculoskeletal: Positive for back pain.  Neurological: Negative for dizziness and headaches.    Patient Active Problem List   Diagnosis Date Noted  . Restless leg syndrome 10/03/2015  . Rib pain on right side 10/03/2015  . Essential hypertension 09/05/2015  . Spinal stenosis at L4-L5 level 09/05/2015  . Family history of colon cancer 09/05/2015  . Gastroesophageal reflux disease without esophagitis 09/05/2015  . Migraine without aura and without status migrainosus, not intractable 09/05/2015  . Red eyes 09/05/2015    Prior to Admission medications   Medication Sig Start Date End Date Taking? Authorizing Provider  ASPIRIN 81 PO Take by mouth.   Yes Historical Provider, MD  B Complex Vitamins (VITAMIN-B COMPLEX) TABS Take by mouth.   Yes Historical Provider, MD  clonazePAM (KLONOPIN) 0.5 MG tablet Take 1 tablet (0.5 mg total) by mouth at bedtime. 03/11/16  Yes Glean Hess, MD  esomeprazole (NEXIUM) 40 MG capsule Take 40 mg by mouth daily at 12 noon.   Yes Historical Provider, MD  gabapentin (NEURONTIN) 300 MG capsule Take by mouth. 02/16/16  Yes Historical Provider, MD  lisinopril (PRINIVIL,ZESTRIL) 40 MG tablet Take 40 mg by mouth daily.   Yes Historical Provider, MD  meloxicam (MOBIC) 15 MG tablet TAKE 1 TABLET BY MOUTH EVERY DAY 02/08/16  Yes Historical Provider, MD  Multiple Vitamins-Minerals (MULTIVITAMIN WITH MINERALS) tablet Take 1 tablet by mouth daily.   Yes Historical Provider, MD  nortriptyline (PAMELOR) 50 MG capsule Take by mouth. 02/16/16  Yes Historical Provider, MD  traMADol (ULTRAM) 50 MG tablet Take 1 tablet (50 mg total) by mouth 4 (four) times daily. 03/21/16  Yes Glean Hess, MD  traMADol (ULTRAM-ER) 100 MG 24 hr tablet Take 100 mg by mouth daily.   Yes Historical Provider, MD  triamterene-hydrochlorothiazide (MAXZIDE) 75-50 MG tablet  06/20/15  Yes Historical Provider, MD    No Known Allergies  Past Surgical History:  Procedure Laterality Date  . colonoscopy  2012    Social History  Substance Use Topics  . Smoking status: Former Research scientist (life sciences)  . Smokeless tobacco: Never Used  . Alcohol use Not on file     Medication list has been reviewed and updated.   Physical Exam  Constitutional: He is oriented to person, place, and time. He appears well-developed. No distress.  HENT:  Head: Normocephalic and atraumatic.  Cardiovascular: Normal rate, regular rhythm and normal heart sounds.  Pulmonary/Chest: Effort normal and breath sounds normal. No respiratory distress.  Musculoskeletal: Normal range of motion.  Neurological: He is alert and oriented to person, place, and time.  Skin: Skin is warm and dry. No rash noted.  Psychiatric: He has a normal mood and affect. His behavior is normal. Thought content normal.  Nursing note and vitals reviewed.   BP 120/82 (BP Location: Right Arm, Patient Position: Sitting, Cuff Size: Normal)  Comment: Has not taken BP med today.  Pulse 85   Ht 5\' 9"  (1.753 m)   Wt 164 lb (74.4 kg)   SpO2 97%   BMI 24.22 kg/m   Assessment and Plan: 1. Essential hypertension controlled  2. Spinal stenosis at L4-L5 level Continue current regimen Pt to call back with name of Neurosurgeon at [facility] for [employer] referral.   No orders of the defined types were placed in this encounter.   Halina Maidens, MD [facility]  05/02/2016

## 2016-07-05 ENCOUNTER — Ambulatory Visit
Admission: EM | Admit: 2016-07-05 | Discharge: 2016-07-05 | Disposition: A | Discharge status: Home / Self Care | Attending: Nurse Practitioner | Admitting: Nurse Practitioner

## 2016-07-05 ENCOUNTER — Encounter: Payer: Self-pay | Admitting: Gynecology

## 2016-07-05 DIAGNOSIS — M5432 Sciatica, left side: Secondary | ICD-10-CM | POA: Diagnosis not present

## 2016-07-05 DIAGNOSIS — M539 Dorsopathy, unspecified: Secondary | ICD-10-CM | POA: Diagnosis not present

## 2016-07-05 DIAGNOSIS — M5386 Other specified dorsopathies, lumbar region: Secondary | ICD-10-CM

## 2016-07-05 MED ORDER — METHYLPREDNISOLONE SODIUM SUCC 125 MG IJ SOLR
125.0000 mg | Freq: Once | INTRAMUSCULAR | Status: AC
  Administered 2016-07-05: 125 mg via INTRAMUSCULAR

## 2016-07-05 MED ORDER — METHYLPREDNISOLONE SODIUM SUCC 125 MG IJ SOLR: 80.0000 mg | Freq: Once | INTRAMUSCULAR | Status: DC

## 2016-07-05 MED ORDER — PREDNISONE 10 MG (21) PO TBPK: ORAL_TABLET | Freq: Every day | ORAL | 0 refills | Status: DC

## 2016-07-05 NOTE — ED Triage Notes (Signed)
Patient c/o sciatica pain . Per patient seen by patient management and his next appointment is on 7/16/1/18. Patient request steroids injection.

## 2016-07-05 NOTE — ED Provider Notes (Signed)
CSN: [id number]     Arrival date & time 07/05/16  1559 History   First MD Initiated Contact with Patient 07/05/16 1722     Chief Complaint  Patient presents with  . Sciatica   (Consider location/radiation/quality/duration/timing/severity/associated sxs/prior Treatment) Subjective:  Robert Gilmore is a 56 y.o. male who presents for evaluation of low back pain. The patient has had recurrent self limited episodes of low back pain in the past and L4-L5 herniated disc, spinal stenosis and sciatica. Current symptoms have been present for 1 week and are gradually worsening.  Onset was related to / precipitated by no known injury. The pain is located in the left sacroiliac area with radiation to the buttock and left leg. The pain is described as sharp, stabbing and tingling and occurs all day. He rates his pain as a 7 on a scale of 0-10. Symptoms are exacerbated by bending and laying flat. Symptoms are minimally improved by heat, narcotic pain medications (tramadol), rest and stretching. He has also tried physical therapy and accupuncture which provided minimal symptom relief. He has no urinary incontinence, bowel incontinence and impotence associated with the back pain. The patient has no "red flag" history indicative of complicated back pain. He is followed by a pain management specialist and has an appointment with his on 7/17. He reports that he has been told that he will need extensive back surgery and is not ready for this at this time.   The following portions of the patient's history were reviewed and updated as appropriate: allergies, current medications, past family history, past medical history, past social history, past surgical history and problem list.      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 Hyperlipidemia   . Hypertension   . Lumbar back pain    chronic L4-L5 pain   Past Surgical History:  Procedure Laterality Date  . colonoscopy  2012   Family History  Problem Relation Age of Onset  . Cervical cancer Mother 50       cervical cancer  . Diabetes Father   . Colon cancer Father 58       colon cancer   Social History  Substance Use Topics  . Smoking status: Former Research scientist (life sciences)  . Smokeless tobacco: Never Used  . Alcohol use No    Review of Systems  Musculoskeletal: Positive for back pain.  All other systems reviewed and are negative.   Allergies  Patient has no known allergies.  Home Medications   Prior to Admission medications   Medication Sig Start Date End Date Taking? Authorizing Provider  ASPIRIN 81 PO Take by mouth.   Yes [provider]  B Complex Vitamins (VITAMIN-B COMPLEX) TABS Take by mouth.   Yes [provider]  clonazePAM (KLONOPIN) 0.5 MG tablet Take 1 tablet (0.5 mg total) by mouth at bedtime. 03/11/16  Yes Glean Hess, MD  esomeprazole (NEXIUM) 40 MG capsule Take 40 mg by mouth daily at 12 noon.   Yes [provider]  gabapentin (NEURONTIN) 300 MG capsule Take by mouth. 02/16/16  Yes [provider]  lisinopril (PRINIVIL,ZESTRIL) 40 MG tablet Take 40 mg by mouth daily.   Yes [provider]  meloxicam (MOBIC) 15 MG tablet TAKE 1 TABLET BY MOUTH EVERY DAY 02/08/16  Yes [provider]  Multiple Vitamins-Minerals (MULTIVITAMIN WITH MINERALS) tablet Take 1  tablet by mouth daily.   Yes [provider]  nortriptyline (PAMELOR) 50 MG capsule Take by mouth. 02/16/16  Yes [provider]  traMADol (ULTRAM) 50 MG tablet Take 1 tablet (50 mg total) by mouth 4 (four) times daily. 03/21/16  Yes Glean Hess, MD  traMADol (ULTRAM-ER) 100 MG 24 hr tablet Take 100 mg by mouth daily.   Yes [provider]  triamterene-hydrochlorothiazide (MAXZIDE) 75-50 MG tablet  06/20/15  Yes [provider]   Meds Ordered and Administered this  Visit   Medications  methylPREDNISolone sodium succinate (SOLU-MEDROL) 125 mg/2 mL injection 80 mg (not administered)    BP (!) 129/98 (BP Location: Left Arm)   Pulse 78   Temp 98.4 F (36.9 C) (Oral)   Resp 16   Ht 5\' 9"  (1.753 m)   Wt 163 lb (73.9 kg)   SpO2 100%   BMI 24.07 kg/m  No data found.   Physical Exam  Constitutional: He is oriented to person, place, and time. He appears well-developed and well-nourished.  HENT:  Head: Normocephalic.  Neck: Normal range of motion.  Cardiovascular: Normal rate and regular rhythm.   Pulmonary/Chest: Effort normal and breath sounds normal.  Musculoskeletal:  Full range of motion with pain and tenderness to the left L4-L5 region. No lumbar spasms or curvature noted.  Normal reflexes, gait, strength. Normal DTRs, muscle strength and reflexes.  Neurological: He is alert and oriented to person, place, and time.  Skin: Skin is warm and dry.  Psychiatric: He has a normal mood and affect.    Urgent Care Course     Procedures (including critical care time)  Labs Review Labs Reviewed - No data to display  Imaging Review No results found.   Visual Acuity Review  Right Eye Distance:   Left Eye Distance:   Bilateral Distance:    Right Eye Near:   Left Eye Near:    Bilateral Near:         MDM   1. Sciatica of left side associated with disorder of lumbar spine     Proper lifting, bending technique discussed. Stretching exercises discussed. Short (2-4 day) period of relative rest recommended until acute symptoms improve. Ice to affected area as needed for local pain relief. Heat to affected area as needed for local pain relief. Follow up with pain management physician on 7/17 as already scheduled    Enrique Sack, [place] 07/05/16 1734

## 2016-07-05 NOTE — Discharge Instructions (Signed)
Follow up with your physician as scheduled on 7/17 Start steroid taper today  Continue tramadol for pain  Ice and heat as needed

## 2016-10-03 ENCOUNTER — Ambulatory Visit (INDEPENDENT_AMBULATORY_CARE_PROVIDER_SITE_OTHER): Admitting: Internal Medicine

## 2016-10-03 ENCOUNTER — Encounter: Payer: Self-pay | Admitting: Internal Medicine

## 2016-10-03 VITALS — BP 122/76 | HR 69 | Ht 69.0 in | Wt 166.2 lb

## 2016-10-03 DIAGNOSIS — Z125 Encounter for screening for malignant neoplasm of prostate: Secondary | ICD-10-CM

## 2016-10-03 DIAGNOSIS — I10 Essential (primary) hypertension: Secondary | ICD-10-CM

## 2016-10-03 DIAGNOSIS — K219 Gastro-esophageal reflux disease without esophagitis: Secondary | ICD-10-CM

## 2016-10-03 DIAGNOSIS — Z0000 Encounter for general adult medical examination without abnormal findings: Secondary | ICD-10-CM

## 2016-10-03 DIAGNOSIS — M48061 Spinal stenosis, lumbar region without neurogenic claudication: Secondary | ICD-10-CM | POA: Diagnosis not present

## 2016-10-03 DIAGNOSIS — Z1211 Encounter for screening for malignant neoplasm of colon: Secondary | ICD-10-CM | POA: Diagnosis not present

## 2016-10-03 DIAGNOSIS — M25562 Pain in left knee: Secondary | ICD-10-CM | POA: Diagnosis not present

## 2016-10-03 DIAGNOSIS — G8929 Other chronic pain: Secondary | ICD-10-CM

## 2016-10-03 DIAGNOSIS — Z23 Encounter for immunization: Secondary | ICD-10-CM

## 2016-10-03 LAB — POCT URINALYSIS DIPSTICK
BILIRUBIN UA: NEGATIVE
Blood, UA: NEGATIVE
Glucose, UA: NEGATIVE
KETONES UA: NEGATIVE
Leukocytes, UA: NEGATIVE
NITRITE UA: NEGATIVE
PROTEIN UA: NEGATIVE
Spec Grav, UA: 1.01 (ref 1.010–1.025)
Urobilinogen, UA: 0.2 E.U./dL
pH, UA: 6.5 (ref 5.0–8.0)

## 2016-10-03 MED ORDER — CYCLOBENZAPRINE HCL 5 MG PO TABS: 5.0000 mg | ORAL_TABLET | Freq: Every day | ORAL | 1 refills | Status: DC | PRN

## 2016-10-03 MED ORDER — LISINOPRIL 40 MG PO TABS: 40.0000 mg | ORAL_TABLET | Freq: Every day | ORAL | 1 refills | Status: DC

## 2016-10-03 MED ORDER — MELOXICAM 15 MG PO TABS: 15.0000 mg | ORAL_TABLET | Freq: Every day | ORAL | 1 refills | Status: DC

## 2016-10-03 MED ORDER — ESOMEPRAZOLE MAGNESIUM 40 MG PO CPDR: 40.0000 mg | DELAYED_RELEASE_CAPSULE | Freq: Every day | ORAL | 1 refills | Status: DC

## 2016-10-03 MED ORDER — TRIAMTERENE-HCTZ 75-50 MG PO TABS: 1.0000 | ORAL_TABLET | Freq: Every day | ORAL | 1 refills | Status: DC

## 2016-10-03 MED ORDER — GABAPENTIN 300 MG PO CAPS: 300.0000 mg | ORAL_CAPSULE | Freq: Every day | ORAL | 1 refills | Status: DC

## 2016-10-03 NOTE — Patient Instructions (Signed)
DASH Eating Plan DASH stands for "Dietary Approaches to Stop Hypertension." The DASH eating plan is a healthy eating plan that has been shown to reduce high blood pressure (hypertension). It may also reduce your risk for type 2 diabetes, heart disease, and stroke. The DASH eating plan may also help with weight loss. What are tips for following this plan? General guidelines  Avoid eating more than 2,300 mg (milligrams) of salt (sodium) a day. If you have hypertension, you may need to reduce your sodium intake to 1,500 mg a day.  Limit alcohol intake to no more than 1 drink a day for nonpregnant women and 2 drinks a day for men. One drink equals 12 oz of beer, 5 oz of wine, or 1 oz of hard liquor.  Work with your health care provider to maintain a healthy body weight or to lose weight. Ask what an ideal weight is for you.  Get at least 30 minutes of exercise that causes your heart to beat faster (aerobic exercise) most days of the week. Activities may include walking, swimming, or biking.  Work with your health care provider or diet and nutrition specialist (dietitian) to adjust your eating plan to your individual calorie needs. Reading food labels  Check food labels for the amount of sodium per serving. Choose foods with less than 5 percent of the Daily Value of sodium. Generally, foods with less than 300 mg of sodium per serving fit into this eating plan.  To find whole grains, look for the word "whole" as the first word in the ingredient list. Shopping  Buy products labeled as "low-sodium" or "no salt added."  Buy fresh foods. Avoid canned foods and premade or frozen meals. Cooking  Avoid adding salt when cooking. Use salt-free seasonings or herbs instead of table salt or sea salt. Check with your health care provider or pharmacist before using salt substitutes.  Do not fry foods. Cook foods using healthy methods such as baking, boiling, grilling, and broiling instead.  Cook with  heart-healthy oils, such as olive, canola, soybean, or sunflower oil. Meal planning   Eat a balanced diet that includes: ? 5 or more servings of fruits and vegetables each day. At each meal, try to fill half of your plate with fruits and vegetables. ? Up to 6-8 servings of whole grains each day. ? Less than 6 oz of lean meat, poultry, or fish each day. A 3-oz serving of meat is about the same size as a deck of cards. One egg equals 1 oz. ? 2 servings of low-fat dairy each day. ? A serving of nuts, seeds, or beans 5 times each week. ? Heart-healthy fats. Healthy fats called Omega-3 fatty acids are found in foods such as flaxseeds and coldwater fish, like sardines, salmon, and mackerel.  Limit how much you eat of the following: ? Canned or prepackaged foods. ? Food that is high in trans fat, such as fried foods. ? Food that is high in saturated fat, such as fatty meat. ? Sweets, desserts, sugary drinks, and other foods with added sugar. ? Full-fat dairy products.  Do not salt foods before eating.  Try to eat at least 2 vegetarian meals each week.  Eat more home-cooked food and less restaurant, buffet, and fast food.  When eating at a restaurant, ask that your food be prepared with less salt or no salt, if possible. What foods are recommended? The items listed may not be a complete list. Talk with your dietitian about what   dietary choices are best for you. Grains Whole-grain or whole-wheat bread. Whole-grain or whole-wheat pasta. Brown rice. Oatmeal. Quinoa. Bulgur. Whole-grain and low-sodium cereals. Pita bread. Low-fat, low-sodium crackers. Whole-wheat flour tortillas. Vegetables Fresh or frozen vegetables (raw, steamed, roasted, or grilled). Low-sodium or reduced-sodium tomato and vegetable juice. Low-sodium or reduced-sodium tomato sauce and tomato paste. Low-sodium or reduced-sodium canned vegetables. Fruits All fresh, dried, or frozen fruit. Canned fruit in natural juice (without  added sugar). Meat and other protein foods Skinless chicken or turkey. Ground chicken or turkey. Pork with fat trimmed off. Fish and seafood. Egg whites. Dried beans, peas, or lentils. Unsalted nuts, nut butters, and seeds. Unsalted canned beans. Lean cuts of beef with fat trimmed off. Low-sodium, lean deli meat. Dairy Low-fat (1%) or fat-free (skim) milk. Fat-free, low-fat, or reduced-fat cheeses. Nonfat, low-sodium ricotta or cottage cheese. Low-fat or nonfat yogurt. Low-fat, low-sodium cheese. Fats and oils Soft margarine without trans fats. Vegetable oil. Low-fat, reduced-fat, or light mayonnaise and salad dressings (reduced-sodium). Canola, safflower, olive, soybean, and sunflower oils. Avocado. Seasoning and other foods Herbs. Spices. Seasoning mixes without salt. Unsalted popcorn and pretzels. Fat-free sweets. What foods are not recommended? The items listed may not be a complete list. Talk with your dietitian about what dietary choices are best for you. Grains Baked goods made with fat, such as croissants, muffins, or some breads. Dry pasta or rice meal packs. Vegetables Creamed or fried vegetables. Vegetables in a cheese sauce. Regular canned vegetables (not low-sodium or reduced-sodium). Regular canned tomato sauce and paste (not low-sodium or reduced-sodium). Regular tomato and vegetable juice (not low-sodium or reduced-sodium). Pickles. Olives. Fruits Canned fruit in a light or heavy syrup. Fried fruit. Fruit in cream or butter sauce. Meat and other protein foods Fatty cuts of meat. Ribs. Fried meat. Bacon. Sausage. Bologna and other processed lunch meats. Salami. Fatback. Hotdogs. Bratwurst. Salted nuts and seeds. Canned beans with added salt. Canned or smoked fish. Whole eggs or egg yolks. Chicken or turkey with skin. Dairy Whole or 2% milk, cream, and half-and-half. Whole or full-fat cream cheese. Whole-fat or sweetened yogurt. Full-fat cheese. Nondairy creamers. Whipped toppings.  Processed cheese and cheese spreads. Fats and oils Butter. Stick margarine. Lard. Shortening. Ghee. Bacon fat. Tropical oils, such as coconut, palm kernel, or palm oil. Seasoning and other foods Salted popcorn and pretzels. Onion salt, garlic salt, seasoned salt, table salt, and sea salt. Worcestershire sauce. Tartar sauce. Barbecue sauce. Teriyaki sauce. Soy sauce, including reduced-sodium. Steak sauce. Canned and packaged gravies. Fish sauce. Oyster sauce. Cocktail sauce. Horseradish that you find on the shelf. Ketchup. Mustard. Meat flavorings and tenderizers. Bouillon cubes. Hot sauce and Tabasco sauce. Premade or packaged marinades. Premade or packaged taco seasonings. Relishes. Regular salad dressings. Where to find more information:  National Heart, Lung, and Blood Institute: www.nhlbi.nih.gov  American Heart Association: www.heart.org Summary  The DASH eating plan is a healthy eating plan that has been shown to reduce high blood pressure (hypertension). It may also reduce your risk for type 2 diabetes, heart disease, and stroke.  With the DASH eating plan, you should limit salt (sodium) intake to 2,300 mg a day. If you have hypertension, you may need to reduce your sodium intake to 1,500 mg a day.  When on the DASH eating plan, aim to eat more fresh fruits and vegetables, whole grains, lean proteins, low-fat dairy, and heart-healthy fats.  Work with your health care provider or diet and nutrition specialist (dietitian) to adjust your eating plan to your individual   calorie needs. This information is not intended to replace advice given to you by your health care provider. Make sure you discuss any questions you have with your health care provider. Document Released: 12/13/2010 Document Revised: 12/18/2015 Document Reviewed: 12/18/2015 Elsevier Interactive Patient Education  2017 Elsevier Inc.  

## 2016-10-03 NOTE — Progress Notes (Signed)
Date:  10/03/2016   Name:  Robert Gilmore   DOB:  Dec 24, 1960   MRN:  [id number]   Chief Complaint: Annual Exam and Hypertension Robert Gilmore is a 56 y.o. male who presents today for his Complete Annual Exam. He feels fairly well. He reports exercising and working. He reports he is sleeping fairly well.   Hypertension  This is a chronic problem. The problem is controlled. Pertinent negatives include no chest pain, palpitations or shortness of breath.  Gastroesophageal Reflux  He complains of abdominal pain (after eating) and heartburn. He reports no chest pain, no sore throat or no wheezing. This is a recurrent problem. The problem occurs frequently. Exacerbated by: eating and taking medication. Pertinent negatives include no fatigue. Risk factors include NSAIDs. He has tried a PPI for the symptoms. The treatment provided mild relief. Past procedures include an EGD. he is also due for Colonoscopy.  Knee Pain   Incident onset: at home - hyperextended his left knee. The pain is present in the left knee. The pain is mild. The pain has been fluctuating since onset. Associated symptoms comments: Feels unstable. Treatments tried: seen by [facility], xrays normal, placed on Mobic.  Back pain - he sees Dr. Hampton Abbot at [facility] [place] for acupuncture and adjunctive therapy for pain. He has his medications prescribed by [facility] neurology and pain. He's been getting some of his medications for blood pressure from a walk-in clinic in [place] as well would like those to be switched under my direction.    Review of Systems  Constitutional: Negative for chills, fatigue and unexpected weight change.  HENT: Negative for postnasal drip, sore throat and trouble swallowing.   Eyes: Negative for visual disturbance.  Respiratory: Negative for chest tightness, shortness of breath and wheezing.   Cardiovascular: Negative for chest pain, palpitations and leg swelling.  Gastrointestinal: Positive for abdominal pain (after eating) and  heartburn. Negative for blood in stool, constipation and diarrhea.  Endocrine: Negative.   Genitourinary: Negative.  Negative for frequency, hematuria and urgency.  Musculoskeletal: Positive for back pain and joint swelling (left knee). Negative for myalgias.  Skin:       Several moles and keratoses  Neurological: Negative for dizziness, tremors and weakness.  Hematological: Negative for adenopathy.  Psychiatric/Behavioral: Positive for sleep disturbance. Negative for agitation and dysphoric mood.    Patient Active Problem List   Diagnosis Date Noted  . Chronic pain of left knee 10/03/2016  . Restless leg syndrome 10/03/2015  . Rib pain on right side 10/03/2015  . Essential hypertension 09/05/2015  . Spinal stenosis at L4-L5 level 09/05/2015  . Family history of colon cancer 09/05/2015  . Gastroesophageal reflux disease without esophagitis 09/05/2015  . Migraine without aura and without status migrainosus, not intractable 09/05/2015  . Red eyes 09/05/2015    Prior to Admission medications   Medication Sig Start Date End Date Taking? Authorizing Provider  ASPIRIN 81 PO Take by mouth.   Yes [provider]  B Complex Vitamins (VITAMIN-B COMPLEX) TABS Take by mouth.   Yes [provider]  clonazePAM (KLONOPIN) 0.5 MG tablet Take 1 tablet (0.5 mg total) by mouth at bedtime. 03/11/16  Yes Glean Hess, MD  esomeprazole (NEXIUM) 40 MG capsule Take 40 mg by mouth daily at 12 noon.   Yes [provider]  gabapentin (NEURONTIN) 300 MG capsule Take by mouth. 02/16/16  Yes [provider]  lisinopril (PRINIVIL,ZESTRIL) 40 MG tablet Take 40 mg by mouth daily.   Yes  [provider]  meloxicam (MOBIC) 15 MG tablet TAKE 1 TABLET BY MOUTH EVERY DAY 02/08/16  Yes [provider]  Multiple Vitamins-Minerals (MULTIVITAMIN WITH MINERALS) tablet Take 1 tablet by mouth daily.   Yes [provider]  nortriptyline (PAMELOR) 50 MG capsule Take by  mouth. 02/16/16  Yes [provider]  traMADol (ULTRAM) 50 MG tablet Take 1 tablet (50 mg total) by mouth 4 (four) times daily. 03/21/16  Yes Glean Hess, MD  triamterene-hydrochlorothiazide [facility]) 75-50 MG tablet  06/20/15  Yes [provider]    No Known Allergies  Past Surgical History:  Procedure Laterality Date  . colonoscopy  2012    Social History  Substance Use Topics  . Smoking status: Former Research scientist (life sciences)  . Smokeless tobacco: Never Used  . Alcohol use No     Medication list has been reviewed and updated.  PHQ 2/9 Scores 10/03/2016 10/03/2015  PHQ - 2 Score 0 0    Physical Exam  Constitutional: He is oriented to person, place, and time. He appears well-developed. No distress.  HENT:  Head: Normocephalic and atraumatic.  Cardiovascular: Normal rate, regular rhythm and normal heart sounds.   Pulmonary/Chest: Effort normal and breath sounds normal. No respiratory distress. He has no wheezes.  Abdominal: Soft. Normal appearance and bowel sounds are normal. There is no tenderness. There is no rebound and no guarding.  Musculoskeletal: He exhibits no edema.       Left knee: He exhibits decreased range of motion and effusion. He exhibits no swelling. Tenderness found. Medial joint line and lateral joint line tenderness noted.  Neurological: He is alert and oriented to person, place, and time. He has normal reflexes. He exhibits normal muscle tone.  Skin: Skin is warm, dry and intact. No rash noted.  Scattered nevi and keratoses on face, chest and back  Psychiatric: He has a normal mood and affect. His speech is normal and behavior is normal. Judgment and thought content normal. Cognition and memory are normal.  Nursing note and vitals reviewed.   BP 122/76 (BP Location: Right Arm, Patient Position: Sitting, Cuff Size: Normal)   Pulse 69   Ht 5\' 9"  (1.753 m)   Wt 166 lb 3.2 oz (75.4 kg)   SpO2 98%   BMI 24.54 kg/m   Assessment and Plan: 1. Annual  physical exam Recommend annual Dermatology exam - Lipid panel  2. Prostate cancer screening DRE deferred - PSA  3. Essential hypertension controlled - lisinopril (PRINIVIL,ZESTRIL) 40 MG tablet; Take 1 tablet (40 mg total) by mouth daily.  Dispense: 90 tablet; Refill: 1 - triamterene-hydrochlorothiazide (MAXZIDE) 75-50 MG tablet; Take 1 tablet by mouth daily.  Dispense: 90 tablet; Refill: 1 - CBC with Differential/Platelet - Comprehensive metabolic panel - POCT urinalysis dipstick  4. Gastroesophageal reflux disease without esophagitis Continue PPI Pt to schedule follow up with GI in [facility] for consultation - likely needs EGD and Colonoscopy - esomeprazole (NEXIUM) 40 MG capsule; Take 1 capsule (40 mg total) by mouth daily at 12 noon.  Dispense: 90 capsule; Refill: 1  5. Spinal stenosis at L4-L5 level meds refilled Continue to see [facility] for Accupuncture Continue to see [facility] for Tramadol - cyclobenzaprine (FLEXERIL) 5 MG tablet; Take 1 tablet (5 mg total) by mouth daily as needed for muscle spasms.  Dispense: 90 tablet; Refill: 1 - meloxicam (MOBIC) 15 MG tablet; Take 1 tablet (15 mg total) by mouth daily.  Dispense: 90 tablet; Refill: 1 - gabapentin (NEURONTIN) 300 MG capsule;  Take 1 capsule (300 mg total) by mouth at bedtime.  Dispense: 90 capsule; Refill: 1  6. Colon cancer screening To contact his GI in [place]  7. Need for influenza vaccination - Flu Vaccine QUAD 36+ mos IM  8. Chronic pain of left knee Needs follow up with ortho   Meds ordered this encounter  Medications  . cyclobenzaprine (FLEXERIL) 5 MG tablet    Sig: Take 1 tablet (5 mg total) by mouth daily as needed for muscle spasms.    Dispense:  90 tablet    Refill:  1  . meloxicam (MOBIC) 15 MG tablet    Sig: Take 1 tablet (15 mg total) by mouth daily.    Dispense:  90 tablet    Refill:  1  . lisinopril (PRINIVIL,ZESTRIL) 40 MG tablet    Sig: Take 1 tablet (40 mg total) by mouth daily.     Dispense:  90 tablet    Refill:  1  . esomeprazole (NEXIUM) 40 MG capsule    Sig: Take 1 capsule (40 mg total) by mouth daily at 12 noon.    Dispense:  90 capsule    Refill:  1  . triamterene-hydrochlorothiazide (MAXZIDE) 75-50 MG tablet    Sig: Take 1 tablet by mouth daily.    Dispense:  90 tablet    Refill:  1  . gabapentin (NEURONTIN) 300 MG capsule    Sig: Take 1 capsule (300 mg total) by mouth at bedtime.    Dispense:  90 capsule    Refill:  1    Partially dictated using Editor, commissioning. Any errors are unintentional.  Halina Maidens, MD [facility]  10/03/2016

## 2016-10-04 ENCOUNTER — Other Ambulatory Visit: Payer: Self-pay | Admitting: Internal Medicine

## 2016-10-04 DIAGNOSIS — K219 Gastro-esophageal reflux disease without esophagitis: Secondary | ICD-10-CM

## 2016-10-04 LAB — COMPREHENSIVE METABOLIC PANEL
A/G RATIO: 1.9 (ref 1.2–2.2)
ALT: 26 IU/L (ref 0–44)
AST: 20 IU/L (ref 0–40)
Albumin: 4 g/dL (ref 3.5–5.5)
Alkaline Phosphatase: 90 IU/L (ref 39–117)
BUN/Creatinine Ratio: 17 (ref 9–20)
BUN: 15 mg/dL (ref 6–24)
Bilirubin Total: 0.3 mg/dL (ref 0.0–1.2)
CALCIUM: 9.1 mg/dL (ref 8.7–10.2)
CO2: 27 mmol/L (ref 20–29)
CREATININE: 0.88 mg/dL (ref 0.76–1.27)
Chloride: 99 mmol/L (ref 96–106)
GFR calc Af Amer: 111 mL/min/{1.73_m2} (ref >59)
GFR calc non Af Amer: 96 mL/min/{1.73_m2} (ref >59)
GLUCOSE: 85 mg/dL (ref 65–99)
Globulin, Total: 2.1 g/dL (ref 1.5–4.5)
Potassium: 3.7 mmol/L (ref 3.5–5.2)
Sodium: 142 mmol/L (ref 134–144)
Total Protein: 6.1 g/dL (ref 6.0–8.5)

## 2016-10-04 LAB — CBC WITH DIFFERENTIAL/PLATELET
BASOS ABS: 0 10*3/uL (ref 0.0–0.2)
Basos: 1 %
EOS (ABSOLUTE): 0.2 10*3/uL (ref 0.0–0.4)
Eos: 3 %
Hematocrit: 41.6 % (ref 37.5–51.0)
Hemoglobin: 14.4 g/dL (ref 13.0–17.7)
IMMATURE GRANS (ABS): 0 10*3/uL (ref 0.0–0.1)
IMMATURE GRANULOCYTES: 0 %
LYMPHS: 33 %
Lymphocytes Absolute: 2.3 10*3/uL (ref 0.7–3.1)
MCH: 29.6 pg (ref 26.6–33.0)
MCHC: 34.6 g/dL (ref 31.5–35.7)
MCV: 86 fL (ref 79–97)
MONOS ABS: 0.6 10*3/uL (ref 0.1–0.9)
Monocytes: 9 %
NEUTROS PCT: 54 %
Neutrophils Absolute: 3.8 10*3/uL (ref 1.4–7.0)
PLATELETS: 259 10*3/uL (ref 150–379)
RBC: 4.86 x10E6/uL (ref 4.14–5.80)
RDW: 13.3 % (ref 12.3–15.4)
WBC: 7 10*3/uL (ref 3.4–10.8)

## 2016-10-04 LAB — LIPID PANEL
CHOLESTEROL TOTAL: 169 mg/dL (ref 100–199)
Chol/HDL Ratio: 4.6 ratio (ref 0.0–5.0)
HDL: 37 mg/dL — AB (ref >39)
LDL CALC: 86 mg/dL (ref 0–99)
TRIGLYCERIDES: 230 mg/dL — AB (ref 0–149)
VLDL Cholesterol Cal: 46 mg/dL — ABNORMAL HIGH (ref 5–40)

## 2016-10-04 LAB — PSA: Prostate Specific Ag, Serum: 1.6 ng/mL (ref 0.0–4.0)

## 2016-10-04 MED ORDER — PANTOPRAZOLE SODIUM 40 MG PO TBEC: 40.0000 mg | DELAYED_RELEASE_TABLET | Freq: Every day | ORAL | 1 refills | Status: DC

## 2016-10-07 ENCOUNTER — Telehealth: Payer: Self-pay

## 2016-10-07 NOTE — Telephone Encounter (Signed)
Left message on pt's VM that ins no longer paid for Nexium/ paperwork was sent in trying to get Pantoprazole paid for

## 2016-12-17 ENCOUNTER — Other Ambulatory Visit: Payer: Self-pay | Admitting: Internal Medicine

## 2016-12-17 ENCOUNTER — Telehealth: Payer: Self-pay | Admitting: Internal Medicine

## 2016-12-17 DIAGNOSIS — G2581 Restless legs syndrome: Secondary | ICD-10-CM

## 2016-12-17 MED ORDER — CLONAZEPAM 0.5 MG PO TABS: 0.5000 mg | ORAL_TABLET | Freq: Every day | ORAL | 1 refills | Status: DC

## 2016-12-17 NOTE — Telephone Encounter (Signed)
Dr Army Melia will refill meds. Thanks.

## 2016-12-17 NOTE — Telephone Encounter (Signed)
Patient came in requesting a refill for clonazePAM (KLONOPIN) 0.5 MG tablet before his appointment Does not [facility], would [facility] be called in at [facility] in [place].

## 2016-12-24 ENCOUNTER — Encounter: Payer: Self-pay | Admitting: Internal Medicine

## 2016-12-24 ENCOUNTER — Ambulatory Visit: Admitting: Internal Medicine

## 2016-12-24 VITALS — BP 146/90 | HR 98 | Ht 69.0 in | Wt 166.0 lb

## 2016-12-24 DIAGNOSIS — M2392 Unspecified internal derangement of left knee: Secondary | ICD-10-CM | POA: Diagnosis not present

## 2016-12-24 NOTE — Progress Notes (Signed)
Date:  12/24/2016   Name:  Robert Gilmore   DOB:  [birth year]   MRN:  [id number]   Chief Complaint: Referral (Needs referral for [facility] stated they can not take referral from Orthopedics- Has to be from PCP. Informed patient we cannot do any MRI on joints UNLESS orthopedic doctor contacts her with information on this and that it needs to be done by PCP. ) and Abdominal Pain (Moved a couch Friday. Had Inguinal mesh in 2015- never had problems but felt burning on and off since then. )  Knee Pain   There was no injury mechanism. The pain is present in the left knee. The quality of the pain is described as aching and stabbing. The pain is moderate. The pain has been constant since onset. Associated symptoms include an inability to bear weight. Pertinent negatives include no numbness. The symptoms are aggravated by weight bearing.  Abdominal Pain  This is a new problem. The current episode started in the past 7 days. The onset quality is sudden. The problem has been gradually improving. The quality of the pain is cramping. Pertinent negatives include no fever.  He has some instablity and effusion.  He has tried bracing and nsaids.  He had the initial injury at work he believes but he did not report it. Reche Dixon ordered the MRI but his PCP in [place] could not sign off because he retired (he has not changed his PCP to me) so MRI was approved.  He has changed it now and thinks that if I order the MRI it will be approved.  Review of Systems  Constitutional: Negative for chills, fatigue and fever.  Respiratory: Negative for chest tightness and shortness of breath.   Cardiovascular: Negative for chest pain and palpitations.  Gastrointestinal: Positive for abdominal pain.  Musculoskeletal: Positive for gait problem and joint swelling.  Neurological: Negative for numbness.    Patient Active Problem List   Diagnosis Date Noted  . Chronic pain of left knee 10/03/2016  . Restless leg syndrome  10/03/2015  . Rib pain on right side 10/03/2015  . Essential hypertension 09/05/2015  . Spinal stenosis at L4-L5 level 09/05/2015  . Family history of colon cancer 09/05/2015  . Gastroesophageal reflux disease without esophagitis 09/05/2015  . Migraine without aura and without status migrainosus, not intractable 09/05/2015  . Red eyes 09/05/2015    Prior to Admission medications   Medication Sig Start Date End Date Taking? Authorizing Provider  ASPIRIN 81 PO Take by mouth.   Yes [provider]  B Complex Vitamins (VITAMIN-B COMPLEX) TABS Take by mouth.   Yes [provider]  clonazePAM (KLONOPIN) 0.5 MG tablet Take 1 tablet (0.5 mg total) by mouth at bedtime. 12/17/16  Yes Glean Hess, MD  cyclobenzaprine (FLEXERIL) 5 MG tablet Take 1 tablet (5 mg total) by mouth daily as needed for muscle spasms. 10/03/16  Yes Glean Hess, MD  gabapentin (NEURONTIN) 300 MG capsule Take 1 capsule (300 mg total) by mouth at bedtime. 10/03/16  Yes Glean Hess, MD  lisinopril (PRINIVIL,ZESTRIL) 40 MG tablet Take 1 tablet (40 mg total) by mouth daily. 10/03/16  Yes Glean Hess, MD  meloxicam (MOBIC) 15 MG tablet Take 1 tablet (15 mg total) by mouth daily. 10/03/16  Yes Glean Hess, MD  Multiple Vitamins-Minerals (MULTIVITAMIN WITH MINERALS) tablet Take 1 tablet by mouth daily.   Yes [provider]  pantoprazole (PROTONIX) 40 MG tablet Take 1 tablet (40 mg  total) by mouth daily. 10/04/16  Yes Glean Hess, MD  traMADol (ULTRAM) 50 MG tablet Take 1 tablet (50 mg total) by mouth 4 (four) times daily. 03/21/16  Yes Glean Hess, MD  triamterene-hydrochlorothiazide (MAXZIDE) 75-50 MG tablet Take 1 tablet by mouth daily. 10/03/16  Yes Glean Hess, MD    No Known Allergies  Past Surgical History:  Procedure Laterality Date  . colonoscopy  2012    Social History   Tobacco Use  . Smoking status: Former Research scientist (life sciences)  . Smokeless tobacco: Never Used    Substance Use Topics  . Alcohol use: No    Alcohol/week: 0.6 oz    Types: 1 Cans of beer per week  . Drug use: No     Medication list has been reviewed and updated.  PHQ 2/9 Scores 10/03/2016 10/03/2015  PHQ - 2 Score 0 0    Physical Exam  Constitutional: He is oriented to person, place, and time. He appears well-developed. No distress.  HENT:  Head: Normocephalic and atraumatic.  Cardiovascular: Normal rate, regular rhythm and normal heart sounds.  Pulmonary/Chest: Effort normal and breath sounds normal. No respiratory distress. He has no wheezes.  Abdominal: Soft. Normal appearance and bowel sounds are normal. He exhibits no distension and no mass. There is no tenderness.  Musculoskeletal:       Left knee: He exhibits decreased range of motion and effusion. He exhibits no deformity.  Neurological: He is alert and oriented to person, place, and time.  Skin: Skin is warm and dry. No rash noted.  Psychiatric: He has a normal mood and affect. His behavior is normal. Thought content normal.  Nursing note and vitals reviewed.   BP (!) 146/90   Pulse 98   Ht 5\' 9"  (1.753 m)   Wt 166 lb (75.3 kg)   SpO2 97%   BMI 24.51 kg/m   Assessment and Plan: 1. Knee derangement syndrome, left Will order MRI and proceed as needed per [employer] - MR Knee Left  Wo Contrast; Future   No orders of the defined types were placed in this encounter.   Partially dictated using Editor, commissioning. Any errors are unintentional.  Halina Maidens, MD [facility]  12/24/2016

## 2016-12-25 ENCOUNTER — Encounter: Payer: Self-pay | Admitting: Internal Medicine

## 2017-01-02 ENCOUNTER — Ambulatory Visit

## 2017-01-08 ENCOUNTER — Encounter: Payer: Self-pay | Admitting: Internal Medicine

## 2017-02-28 ENCOUNTER — Encounter: Payer: Self-pay | Admitting: Internal Medicine

## 2017-02-28 ENCOUNTER — Ambulatory Visit (INDEPENDENT_AMBULATORY_CARE_PROVIDER_SITE_OTHER): Admitting: Internal Medicine

## 2017-02-28 VITALS — BP 128/82 | HR 97 | Ht 69.0 in | Wt 164.0 lb

## 2017-02-28 DIAGNOSIS — M2392 Unspecified internal derangement of left knee: Secondary | ICD-10-CM | POA: Diagnosis not present

## 2017-02-28 DIAGNOSIS — M48061 Spinal stenosis, lumbar region without neurogenic claudication: Secondary | ICD-10-CM | POA: Diagnosis not present

## 2017-02-28 DIAGNOSIS — K219 Gastro-esophageal reflux disease without esophagitis: Secondary | ICD-10-CM | POA: Diagnosis not present

## 2017-02-28 DIAGNOSIS — I10 Essential (primary) hypertension: Secondary | ICD-10-CM

## 2017-02-28 DIAGNOSIS — D485 Neoplasm of uncertain behavior of skin: Secondary | ICD-10-CM

## 2017-02-28 MED ORDER — GABAPENTIN 300 MG PO CAPS: 600.0000 mg | ORAL_CAPSULE | Freq: Every day | ORAL | 1 refills | Status: DC

## 2017-02-28 MED ORDER — TRIAMTERENE-HCTZ 75-50 MG PO TABS: 1.0000 | ORAL_TABLET | Freq: Every day | ORAL | 1 refills | Status: DC

## 2017-02-28 MED ORDER — PANTOPRAZOLE SODIUM 40 MG PO TBEC: 40.0000 mg | DELAYED_RELEASE_TABLET | Freq: Two times a day (BID) | ORAL | 1 refills | Status: DC

## 2017-02-28 MED ORDER — LISINOPRIL 40 MG PO TABS: 40.0000 mg | ORAL_TABLET | Freq: Every day | ORAL | 1 refills | Status: DC

## 2017-02-28 NOTE — Progress Notes (Signed)
Date:  02/28/2017   Name:  Robert Gilmore   DOB:  10/04/1960   MRN:  [id number]   Chief Complaint: Referral (Needs refill on gabapentin but wants to know if it can be increased since having to take twice daily at bedtime. ) and Hypertension (Needs refill on maxzide medication, and klonopin. )  Hypertension  This is a chronic problem. The problem is unchanged. The problem is controlled. Pertinent negatives include no chest pain, headaches, palpitations or shortness of breath. Past treatments include diuretics. The current treatment provides significant improvement. There is no history of kidney disease, CAD/MI or CVA.  Back Pain  This is a chronic problem. The problem occurs daily. The problem has been waxing and waning since onset. The pain is present in the lumbar spine. The pain is moderate. Pertinent negatives include no abdominal pain, bladder incontinence, bowel incontinence, chest pain, fever, headaches or weakness. Treatments tried: gabapentin - would like to try higher dose. The treatment provided moderate relief.  Knee Pain   There was no injury mechanism. The pain is present in the left knee. The pain has been worsening (MRI done last month - internal derangements noted) since onset.  Gastroesophageal Reflux  He complains of heartburn. He reports no abdominal pain, no chest pain, no coughing or no wheezing. This is a recurrent problem. The problem occurs frequently. Pertinent negatives include no fatigue. He has tried a PPI (did better on nexium 40 mg than protonix 40 mg) for the symptoms. The treatment provided moderate relief.  Skin lesion - has mole removed from his back - now area is looking suspicious.  He needs a referral back to the Derm.   Review of Systems  Constitutional: Negative for chills, fatigue and fever.  Respiratory: Negative for cough, chest tightness, shortness of breath and wheezing.   Cardiovascular: Negative for chest pain, palpitations and leg swelling.    Gastrointestinal: Positive for heartburn. Negative for abdominal pain, bowel incontinence, diarrhea and vomiting.  Genitourinary: Negative for bladder incontinence.  Musculoskeletal: Positive for arthralgias and back pain.  Neurological: Negative for dizziness, weakness and headaches.    Patient Active Problem List   Diagnosis Date Noted  . Knee derangement syndrome, left 01/08/2017  . Chronic pain of left knee 10/03/2016  . Restless leg syndrome 10/03/2015  . Rib pain on right side 10/03/2015  . Essential hypertension 09/05/2015  . Spinal stenosis at L4-L5 level 09/05/2015  . Family history of colon cancer 09/05/2015  . Gastroesophageal reflux disease without esophagitis 09/05/2015  . Migraine without aura and without status migrainosus, not intractable 09/05/2015  . Red eyes 09/05/2015    Prior to Admission medications   Medication Sig Start Date End Date Taking? Authorizing Provider  ASPIRIN 81 PO Take by mouth.   Yes [provider]  B Complex Vitamins (VITAMIN-B COMPLEX) TABS Take by mouth.   Yes [provider]  clonazePAM (KLONOPIN) 0.5 MG tablet Take 1 tablet (0.5 mg total) by mouth at bedtime. 12/17/16  Yes Glean Hess, MD  cyclobenzaprine (FLEXERIL) 5 MG tablet Take 1 tablet (5 mg total) by mouth daily as needed for muscle spasms. 10/03/16  Yes Glean Hess, MD  gabapentin (NEURONTIN) 300 MG capsule Take 1 capsule (300 mg total) by mouth at bedtime. Patient taking differently: Take 300 mg by mouth 2 (two) times daily.  10/03/16  Yes Glean Hess, MD  lisinopril (PRINIVIL,ZESTRIL) 40 MG tablet Take 1 tablet (40 mg total) by mouth daily. 10/03/16  Yes Army Melia,  Jesse Sans, MD  meloxicam (MOBIC) 15 MG tablet Take 1 tablet (15 mg total) by mouth daily. 10/03/16  Yes Glean Hess, MD  Multiple Vitamins-Minerals (MULTIVITAMIN WITH MINERALS) tablet Take 1 tablet by mouth daily.   Yes [provider]  OVER THE COUNTER MEDICATION Karton 6 mg  daily   Yes [provider]  pantoprazole (PROTONIX) 40 MG tablet Take 1 tablet (40 mg total) by mouth daily. 10/04/16  Yes Glean Hess, MD  traMADol (ULTRAM) 50 MG tablet Take 1 tablet (50 mg total) by mouth 4 (four) times daily. 03/21/16  Yes Glean Hess, MD  triamterene-hydrochlorothiazide (MAXZIDE) 75-50 MG tablet Take 1 tablet by mouth daily. 10/03/16  Yes Glean Hess, MD    No Known Allergies  Past Surgical History:  Procedure Laterality Date  . colonoscopy  2012    Social History   Tobacco Use  . Smoking status: Former Research scientist (life sciences)  . Smokeless tobacco: Never Used  Substance Use Topics  . Alcohol use: No    Alcohol/week: 0.6 oz    Types: 1 Cans of beer per week  . Drug use: No     Medication list has been reviewed and updated.  PHQ 2/9 Scores 10/03/2016 10/03/2015  PHQ - 2 Score 0 0    Physical Exam  Constitutional: He is oriented to person, place, and time. He appears well-developed. No distress.  HENT:  Head: Normocephalic and atraumatic.  Neck: Normal range of motion.  Cardiovascular: Normal rate, regular rhythm and normal heart sounds.  Pulmonary/Chest: Effort normal and breath sounds normal. No respiratory distress. He has no wheezes.  Musculoskeletal: He exhibits no edema.       Left knee: He exhibits no effusion.  Neurological: He is alert and oriented to person, place, and time.  Skin: Skin is warm and dry.  Psychiatric: His speech is normal and behavior is normal. Thought content normal. He exhibits a depressed mood.  Nursing note and vitals reviewed.   BP 128/82   Pulse 97   Ht 5\' 9"  (1.753 m)   Wt 164 lb (74.4 kg)   SpO2 95%   BMI 24.22 kg/m   Assessment and Plan: 1. Essential hypertension controlled - triamterene-hydrochlorothiazide (MAXZIDE) 75-50 MG tablet; Take 1 tablet by mouth daily.  Dispense: 90 tablet; Refill: 1 - lisinopril (PRINIVIL,ZESTRIL) 40 MG tablet; Take 1 tablet (40 mg total) by mouth daily.  Dispense: 90  tablet; Refill: 1  2. Knee derangement syndrome, left MRI report scanned into chart - Ambulatory referral to Orthopedic Surgery  3. Spinal stenosis at L4-L5 level Will increase gabapentin to 600-900 mg a HS - gabapentin (NEURONTIN) 300 MG capsule; Take 2-3 capsules (600-900 mg total) by mouth at bedtime.  Dispense: 270 capsule; Refill: 1  4. Gastroesophageal reflux disease without esophagitis Not controlled on once daily - increase to bid - pantoprazole (PROTONIX) 40 MG tablet; Take 1 tablet (40 mg total) by mouth 2 (two) times daily.  Dispense: 180 tablet; Refill: 1  5. Neoplasm of uncertain behavior of skin - Ambulatory referral to Dermatology   Meds ordered this encounter  Medications  . gabapentin (NEURONTIN) 300 MG capsule    Sig: Take 2-3 capsules (600-900 mg total) by mouth at bedtime.    Dispense:  270 capsule    Refill:  1  . triamterene-hydrochlorothiazide (MAXZIDE) 75-50 MG tablet    Sig: Take 1 tablet by mouth daily.    Dispense:  90 tablet    Refill:  1  .  lisinopril (PRINIVIL,ZESTRIL) 40 MG tablet    Sig: Take 1 tablet (40 mg total) by mouth daily.    Dispense:  90 tablet    Refill:  1  . pantoprazole (PROTONIX) 40 MG tablet    Sig: Take 1 tablet (40 mg total) by mouth 2 (two) times daily.    Dispense:  180 tablet    Refill:  1    Partially dictated using Editor, commissioning. Any errors are unintentional.  Halina Maidens, MD [facility]  02/28/2017

## 2017-05-07 ENCOUNTER — Other Ambulatory Visit: Payer: Self-pay | Admitting: Internal Medicine

## 2017-05-09 ENCOUNTER — Other Ambulatory Visit: Payer: Self-pay | Admitting: Internal Medicine

## 2017-05-09 DIAGNOSIS — I10 Essential (primary) hypertension: Secondary | ICD-10-CM

## 2017-05-18 ENCOUNTER — Other Ambulatory Visit: Payer: Self-pay | Admitting: Internal Medicine

## 2017-05-18 DIAGNOSIS — G2581 Restless legs syndrome: Secondary | ICD-10-CM

## 2017-07-05 ENCOUNTER — Other Ambulatory Visit: Payer: Self-pay | Admitting: Internal Medicine

## 2017-07-05 DIAGNOSIS — M48061 Spinal stenosis, lumbar region without neurogenic claudication: Secondary | ICD-10-CM

## 2017-08-29 ENCOUNTER — Ambulatory Visit (INDEPENDENT_AMBULATORY_CARE_PROVIDER_SITE_OTHER): Admitting: Internal Medicine

## 2017-08-29 ENCOUNTER — Encounter: Payer: Self-pay | Admitting: Internal Medicine

## 2017-08-29 VITALS — BP 124/80 | HR 75 | Ht 69.0 in | Wt 164.0 lb

## 2017-08-29 DIAGNOSIS — G2581 Restless legs syndrome: Secondary | ICD-10-CM | POA: Diagnosis not present

## 2017-08-29 DIAGNOSIS — M48061 Spinal stenosis, lumbar region without neurogenic claudication: Secondary | ICD-10-CM | POA: Diagnosis not present

## 2017-08-29 DIAGNOSIS — I10 Essential (primary) hypertension: Secondary | ICD-10-CM

## 2017-08-29 MED ORDER — MELOXICAM 15 MG PO TABS: 15.0000 mg | ORAL_TABLET | Freq: Every day | ORAL | 1 refills | Status: DC

## 2017-08-29 NOTE — Progress Notes (Signed)
Date:  08/29/2017   Name:  Robert Gilmore   DOB:  08-May-1960   MRN:  [id number]   Chief Complaint: Hypertension (Need refill meloxicam ( need to discuss doubling dose or increasing dose.) , and klonazepam. Wants to get order for MRI- thoracic L4 -L5)  Hypertension  This is a chronic problem. The problem is controlled. Pertinent negatives include no chest pain, headaches, palpitations or shortness of breath. Past treatments include ACE inhibitors and diuretics.  Back Pain  This is a chronic problem. The problem occurs daily. The problem is unchanged. The pain is present in the lumbar spine. The pain is moderate. Pertinent negatives include no chest pain, dysuria, fever or headaches.  RLS - still on clonazepam at HS. He has been taking mobic 15 mg twice a day under the mistaken impression that he could increase the dose.  It has helped his knees and back but it exceeds total daily dose recommended. He is being transitioned to Lyrica and Suboxone from Tramadol by the pain clinic.  We discussed getting the pain clinic to order the MRI and I can not be the middle man.   Review of Systems  Constitutional: Negative for chills, fatigue and fever.  Respiratory: Negative for choking, shortness of breath and wheezing.   Cardiovascular: Negative for chest pain, palpitations and leg swelling.  Gastrointestinal: Negative for anal bleeding and blood in stool.  Genitourinary: Negative for dysuria and hematuria.  Musculoskeletal: Positive for back pain.  Neurological: Negative for dizziness and headaches.       RLS in the evening - well controlled  Psychiatric/Behavioral: Negative for dysphoric mood. The patient is not nervous/anxious.     Patient Active Problem List   Diagnosis Date Noted  . Knee derangement syndrome, left 01/08/2017  . Chronic pain of left knee 10/03/2016  . Restless leg syndrome 10/03/2015  . Rib pain on right side 10/03/2015  . Essential hypertension 09/05/2015  . Spinal  stenosis at L4-L5 level 09/05/2015  . Family history of colon cancer 09/05/2015  . Gastroesophageal reflux disease without esophagitis 09/05/2015  . Migraine without aura and without status migrainosus, not intractable 09/05/2015  . Red eyes 09/05/2015    No Known Allergies  Past Surgical History:  Procedure Laterality Date  . colonoscopy  2012    Social History   Tobacco Use  . Smoking status: Former Research scientist (life sciences)  . Smokeless tobacco: Never Used  Substance Use Topics  . Alcohol use: No    Alcohol/week: 1.0 standard drinks    Types: 1 Cans of beer per week  . Drug use: No     Medication list has been reviewed and updated.  Current Meds  Medication Sig  . ASPIRIN 81 PO Take by mouth.  . B Complex Vitamins (VITAMIN-B COMPLEX) TABS Take by mouth.  . Buprenorphine HCl-Naloxone HCl (SUBOXONE) 4-1 MG FILM Place under the tongue.  . clonazePAM (KLONOPIN) 0.5 MG tablet TAKE 1 TABLET BY MOUTH EVERY NIGHT AT BEDTIME.  . cyclobenzaprine (FLEXERIL) 5 MG tablet TAKE 1 TABLET DAILY AS NEEDED FOR MUSCLE SPASMS  . lisinopril (PRINIVIL,ZESTRIL) 40 MG tablet Take 1 tablet (40 mg total) by mouth daily.  . meloxicam (MOBIC) 15 MG tablet Take 1 tablet (15 mg total) by mouth daily.  . Multiple Vitamins-Minerals (MULTIVITAMIN WITH MINERALS) tablet Take 1 tablet by mouth daily.  Marland Kitchen OVER THE COUNTER MEDICATION Karton 6 mg daily  . pantoprazole (PROTONIX) 40 MG tablet Take 1 tablet (40 mg total) by mouth 2 (two) times daily.  Marland Kitchen  traMADol (ULTRAM) 50 MG tablet Take 1 tablet (50 mg total) by mouth 4 (four) times daily.  Marland Kitchen triamterene-hydrochlorothiazide (MAXZIDE) 75-50 MG tablet TAKE 1 TABLET DAILY    PHQ 2/9 Scores 10/03/2016 10/03/2015  PHQ - 2 Score 0 0    Physical Exam  BP 124/80 (BP Location: Right Arm, Patient Position: Sitting, Cuff Size: Normal)   Pulse 75   Ht 5\' 9"  (1.753 m)   Wt 164 lb (74.4 kg)   SpO2 98%   BMI 24.22 kg/m   Assessment and Plan: 1. Essential  hypertension controlled - Comprehensive metabolic panel  2. Spinal stenosis at L4-L5 level Return to pain clinic for MRI Reduce mobic on once a day - can try 1/2 twice a day - meloxicam (MOBIC) 15 MG tablet; Take 1 tablet (15 mg total) by mouth daily.  Dispense: 90 tablet; Refill: 1  3. Restless leg syndrome controlled   Meds ordered this encounter  Medications  . meloxicam (MOBIC) 15 MG tablet    Sig: Take 1 tablet (15 mg total) by mouth daily.    Dispense:  90 tablet    Refill:  1    Partially dictated using Editor, commissioning. Any errors are unintentional.  Halina Maidens, MD [facility]  08/29/2017

## 2017-11-05 ENCOUNTER — Other Ambulatory Visit: Payer: Self-pay | Admitting: Internal Medicine

## 2017-11-05 DIAGNOSIS — I10 Essential (primary) hypertension: Secondary | ICD-10-CM

## 2017-11-07 ENCOUNTER — Other Ambulatory Visit: Payer: Self-pay | Admitting: Internal Medicine

## 2017-11-07 DIAGNOSIS — K219 Gastro-esophageal reflux disease without esophagitis: Secondary | ICD-10-CM

## 2017-12-15 ENCOUNTER — Ambulatory Visit: Admitting: Internal Medicine

## 2017-12-15 ENCOUNTER — Encounter: Payer: Self-pay | Admitting: Internal Medicine

## 2017-12-15 VITALS — BP 132/78 | HR 84 | Ht 69.0 in | Wt 161.0 lb

## 2017-12-15 DIAGNOSIS — M48061 Spinal stenosis, lumbar region without neurogenic claudication: Secondary | ICD-10-CM

## 2017-12-15 DIAGNOSIS — G2581 Restless legs syndrome: Secondary | ICD-10-CM

## 2017-12-15 DIAGNOSIS — F331 Major depressive disorder, recurrent, moderate: Secondary | ICD-10-CM

## 2017-12-15 DIAGNOSIS — M67441 Ganglion, right hand: Secondary | ICD-10-CM

## 2017-12-15 DIAGNOSIS — R109 Unspecified abdominal pain: Secondary | ICD-10-CM

## 2017-12-15 MED ORDER — MELOXICAM 15 MG PO TABS: 15.0000 mg | ORAL_TABLET | Freq: Every day | ORAL | 1 refills | Status: DC

## 2017-12-15 MED ORDER — DULOXETINE HCL 60 MG PO CPEP: 60.0000 mg | ORAL_CAPSULE | Freq: Every day | ORAL | 1 refills | Status: DC

## 2017-12-15 MED ORDER — CLONAZEPAM 0.5 MG PO TABS: 0.5000 mg | ORAL_TABLET | Freq: Every day | ORAL | 1 refills | Status: DC

## 2017-12-15 NOTE — Progress Notes (Signed)
Date:  12/15/2017   Name:  Robert Gilmore   DOB:  [birth year]   MRN:  [id number]   Chief Complaint: Abdominal Pain (Started off and on for 4 months. Thinks its the inguinial herniated mesh. Dull ache.  ); Depression (Needs refill Clonazepam. PHQ9- 20. Had psych eval for chronic pain. Dxed with mild depression caused by chronic pain. [facility]. ); and Arthritis (6 plus months. Getting worse. Pain in pointing finger and middle finger. Getting worse. Needs refill on Meloxicam.  )  Abdominal Pain  This is a chronic problem. The current episode started more than 1 month ago. The problem occurs every several days. The pain is mild. The quality of the pain is aching. The abdominal pain radiates to the RLQ. Associated symptoms include myalgias. Pertinent negatives include no constipation, diarrhea, fever, frequency or headaches. had bilateral ing hernia with mesh in 2016  Depression         This is a chronic problem.  The onset quality is gradual.   The problem has been gradually worsening since onset.  Associated symptoms include myalgias.  Associated symptoms include no fatigue, no headaches and no suicidal ideas.     The symptoms are aggravated by social issues.  Past treatments include nothing.  (had bilateral ing hernia with mesh in 2016) He has never been treated specifically for depression - always meds like lyrica or gabapentin.  He admits that his sx are worsening, exacerbated by chronic pain syndrome.  RLS - using clonazepam nightly. Also helps with anxiety and palpitations from reducing the dose of tramadol.  He is in transition from suboxone back to tramadol extended release but is only taking short acting now.    Finger pain - in index and middle fingers of right hand MCP joint, worsening over the past 6 months.  No recent trauma.  Also has a cyst just lateral to the middle finger MCP joint.  He has morning stiffness that resolves quickly.  No joint redness or swelling, no locking.  Back pain - he  has tapered off of tramadol but not completely.  Also taking mobic with some benefit.  Still seeing pain management but they only prescribe pain meds.  Review of Systems  Constitutional: Positive for unexpected weight change (last had decreased appetite). Negative for chills, fatigue and fever.  HENT: Negative for trouble swallowing.   Respiratory: Negative for cough, chest tightness, shortness of breath and wheezing.   Cardiovascular: Negative for chest pain, palpitations and leg swelling.  Gastrointestinal: Positive for abdominal pain. Negative for blood in stool, constipation and diarrhea.  Genitourinary: Negative for frequency, penile pain, testicular pain and urgency.  Musculoskeletal: Positive for back pain and myalgias.  Skin: Negative for color change and rash.  Allergic/Immunologic: Negative for environmental allergies.  Neurological: Negative for dizziness and headaches.  Psychiatric/Behavioral: Positive for depression, dysphoric mood and sleep disturbance. Negative for suicidal ideas.    Patient Active Problem List   Diagnosis Date Noted  . Chronic pain of left knee 10/03/2016  . Restless leg syndrome 10/03/2015  . Rib pain on right side 10/03/2015  . Essential hypertension 09/05/2015  . Spinal stenosis at L4-L5 level 09/05/2015  . Family history of colon cancer 09/05/2015  . Gastroesophageal reflux disease without esophagitis 09/05/2015  . Migraine without aura and without status migrainosus, not intractable 09/05/2015    No Known Allergies  Past Surgical History:  Procedure Laterality Date  . colonoscopy  2012    Social History   Tobacco Use  .  Smoking status: Former Research scientist (life sciences)  . Smokeless tobacco: Never Used  Substance Use Topics  . Alcohol use: No    Alcohol/week: 1.0 standard drinks    Types: 1 Cans of beer per week  . Drug use: No     Medication list has been reviewed and updated.  Current Meds  Medication Sig  . ASPIRIN 81 PO Take by mouth.  . B  Complex Vitamins (VITAMIN-B COMPLEX) TABS Take by mouth.  . clonazePAM (KLONOPIN) 0.5 MG tablet TAKE 1 TABLET BY MOUTH EVERY NIGHT AT BEDTIME.  . cyclobenzaprine (FLEXERIL) 5 MG tablet TAKE 1 TABLET DAILY AS NEEDED FOR MUSCLE SPASMS  . lisinopril (PRINIVIL,ZESTRIL) 40 MG tablet Take 1 tablet (40 mg total) by mouth daily.  . meloxicam (MOBIC) 15 MG tablet Take 1 tablet (15 mg total) by mouth daily.  . Multiple Vitamins-Minerals (MULTIVITAMIN WITH MINERALS) tablet Take 1 tablet by mouth daily.  Marland Kitchen OVER THE COUNTER MEDICATION Karton 6 mg daily  . pantoprazole (PROTONIX) 40 MG tablet TAKE 1 TABLET DAILY  . traMADol (ULTRAM) 50 MG tablet Take 1 tablet (50 mg total) by mouth 4 (four) times daily.  Marland Kitchen triamterene-hydrochlorothiazide (MAXZIDE) 75-50 MG tablet TAKE 1 TABLET DAILY    PHQ 2/9 Scores 12/15/2017 10/03/2016 10/03/2015  PHQ - 2 Score 5 0 0  PHQ- 9 Score 20 - -    Physical Exam  Constitutional: He is oriented to person, place, and time. He appears well-developed. No distress.  HENT:  Head: Normocephalic and atraumatic.  Cardiovascular: Normal rate, regular rhythm and normal heart sounds.  Pulmonary/Chest: Effort normal. No respiratory distress.  Abdominal: Soft. Normal appearance and bowel sounds are normal. There is tenderness (over right iliac crest anteriorly).  Musculoskeletal: Normal range of motion.       Arms: Neurological: He is alert and oriented to person, place, and time.  Skin: Skin is warm and dry. No rash noted.  Psychiatric: His speech is normal and behavior is normal. Thought content normal. His affect is blunt. He exhibits a depressed mood. He expresses no suicidal ideation. He expresses no suicidal plans.  Nursing note and vitals reviewed.   BP 132/78 (BP Location: Right Arm, Patient Position: Sitting, Cuff Size: Normal)   Pulse 84   Ht 5\' 9"  (1.753 m)   Wt 161 lb (73 kg)   SpO2 94%   BMI 23.78 kg/m   Assessment and Plan: 1. Spinal stenosis at L4-L5  level Continue mobic, pain meds from pain management - meloxicam (MOBIC) 15 MG tablet; Take 1 tablet (15 mg total) by mouth daily.  Dispense: 90 tablet; Refill: 1  2. Restless leg syndrome Continue clonazepam - clonazePAM (KLONOPIN) 0.5 MG tablet; Take 1 tablet (0.5 mg total) by mouth at bedtime.  Dispense: 90 tablet; Refill: 1 - Comprehensive metabolic panel  3. Moderate episode of recurrent major depressive disorder ([place]) Begin cymbalta for possible dual effect Follow up in one month - DULoxetine (CYMBALTA) 60 MG capsule; Take 1 capsule (60 mg total) by mouth daily.  Dispense: 30 capsule; Refill: 1 - TSH  4. Mucous cyst of digit of right hand - Ambulatory referral to Orthopedic Surgery  5. Abdominal wall pain Suspect scar tissue from hernia repair Watchful waiting recommended   Partially dictated using Dragon software. Any errors are unintentional.  Halina Maidens, MD [facility]  12/15/2017

## 2017-12-16 LAB — COMPREHENSIVE METABOLIC PANEL
ALBUMIN: 4.6 g/dL (ref 3.5–5.5)
ALT: 33 IU/L (ref 0–44)
AST: 16 IU/L (ref 0–40)
Albumin/Globulin Ratio: 2 (ref 1.2–2.2)
Alkaline Phosphatase: 101 IU/L (ref 39–117)
BILIRUBIN TOTAL: 0.3 mg/dL (ref 0.0–1.2)
BUN / CREAT RATIO: 16 (ref 9–20)
BUN: 18 mg/dL (ref 6–24)
CO2: 26 mmol/L (ref 20–29)
CREATININE: 1.1 mg/dL (ref 0.76–1.27)
Calcium: 10.1 mg/dL (ref 8.7–10.2)
Chloride: 96 mmol/L (ref 96–106)
GFR, EST AFRICAN AMERICAN: 86 mL/min/{1.73_m2} (ref >59)
GFR, EST NON AFRICAN AMERICAN: 74 mL/min/{1.73_m2} (ref >59)
GLUCOSE: 90 mg/dL (ref 65–99)
Globulin, Total: 2.3 g/dL (ref 1.5–4.5)
Potassium: 3.7 mmol/L (ref 3.5–5.2)
Sodium: 142 mmol/L (ref 134–144)
TOTAL PROTEIN: 6.9 g/dL (ref 6.0–8.5)

## 2017-12-16 LAB — TSH: TSH: 2.16 u[IU]/mL (ref 0.450–4.500)

## 2018-01-16 ENCOUNTER — Other Ambulatory Visit: Payer: Self-pay

## 2018-01-16 DIAGNOSIS — G8929 Other chronic pain: Secondary | ICD-10-CM

## 2018-01-16 DIAGNOSIS — M48061 Spinal stenosis, lumbar region without neurogenic claudication: Secondary | ICD-10-CM

## 2018-01-16 DIAGNOSIS — M25562 Pain in left knee: Secondary | ICD-10-CM

## 2018-01-16 DIAGNOSIS — G2581 Restless legs syndrome: Secondary | ICD-10-CM

## 2018-01-16 DIAGNOSIS — M2392 Unspecified internal derangement of left knee: Secondary | ICD-10-CM

## 2018-01-19 ENCOUNTER — Ambulatory Visit: Admitting: Internal Medicine

## 2018-01-19 ENCOUNTER — Encounter: Payer: Self-pay | Admitting: Internal Medicine

## 2018-01-19 VITALS — BP 98/68 | HR 108 | Ht 69.0 in | Wt 157.0 lb

## 2018-01-19 DIAGNOSIS — M7121 Synovial cyst of popliteal space [Baker], right knee: Secondary | ICD-10-CM

## 2018-01-19 DIAGNOSIS — I10 Essential (primary) hypertension: Secondary | ICD-10-CM | POA: Diagnosis not present

## 2018-01-19 DIAGNOSIS — F331 Major depressive disorder, recurrent, moderate: Secondary | ICD-10-CM | POA: Diagnosis not present

## 2018-01-19 DIAGNOSIS — G2581 Restless legs syndrome: Secondary | ICD-10-CM | POA: Diagnosis not present

## 2018-01-19 MED ORDER — CLONAZEPAM 0.5 MG PO TABS: 0.5000 mg | ORAL_TABLET | Freq: Every day | ORAL | 1 refills | Status: DC

## 2018-01-19 NOTE — Progress Notes (Signed)
Date:  01/19/2018   Name:  Robert Gilmore   DOB:  09/21/1960   MRN:  [id number]   Chief Complaint: Depression (Follow up on Cymbalta.) and Leg Swelling (Right leg swelling. Injured leg X 2 weeks ago.Concerned of another blood clot or if swelling is from injury.  Swelling knee cap and calf, tightness in quad. Gait is off. )  Depression         This is a new problem.  The current episode started more than 1 month ago.   The onset quality is undetermined.   Associated symptoms include decreased concentration, appetite change and sad.  Associated symptoms include no fatigue and no suicidal ideas.     The symptoms are aggravated by work stress.  Past treatments include SNRIs - Serotonin and norepinephrine reuptake inhibitors (cymbalta added a month ago). Hypertension  This is a chronic problem. The problem is unchanged. The problem is controlled. Pertinent negatives include no chest pain, palpitations or shortness of breath. Past treatments include ACE inhibitors and diuretics. The current treatment provides significant improvement. There are no compliance problems.   Knee pain - he had similar sx several years ago with mild swelling of his right knee.  His PCP said he had a blood clot but did not do any studies.  It resolved without any treatment.  Several days ago he hyperextended his knees and the sx recurred.  Review of Systems  Constitutional: Positive for appetite change and unexpected weight change. Negative for fatigue and fever.  Respiratory: Negative for cough, chest tightness, shortness of breath and wheezing.   Cardiovascular: Negative for chest pain, palpitations and leg swelling.  Musculoskeletal: Positive for arthralgias (hands) and joint swelling. Gait problem: back of right knee.  Skin: Negative for color change and rash.  Psychiatric/Behavioral: Positive for decreased concentration and depression. Negative for suicidal ideas.    Patient Active Problem List   Diagnosis Date Noted    . Abdominal wall pain 12/15/2017  . Mucous cyst of digit of right hand 12/15/2017  . Moderate episode of recurrent major depressive disorder ([place]) 12/15/2017  . Chronic pain of left knee 10/03/2016  . Restless leg syndrome 10/03/2015  . Rib pain on right side 10/03/2015  . Essential hypertension 09/05/2015  . Spinal stenosis at L4-L5 level 09/05/2015  . Family history of colon cancer 09/05/2015  . Gastroesophageal reflux disease without esophagitis 09/05/2015  . Migraine without aura and without status migrainosus, not intractable 09/05/2015    No Known Allergies  Past Surgical History:  Procedure Laterality Date  . colonoscopy  2012  . INGUINAL HERNIA REPAIR Bilateral 2015    Social History   Tobacco Use  . Smoking status: Former Research scientist (life sciences)  . Smokeless tobacco: Never Used  Substance Use Topics  . Alcohol use: No    Alcohol/week: 1.0 standard drinks    Types: 1 Cans of beer per week  . Drug use: No     Medication list has been reviewed and updated.  Current Meds  Medication Sig  . ASPIRIN 81 PO Take by mouth.  . B Complex Vitamins (VITAMIN-B COMPLEX) TABS Take by mouth.  . Buprenorphine HCl (BELBUCA) 150 MCG FILM Place 1 patch inside cheek 2 (two) times daily.  . clonazePAM (KLONOPIN) 0.5 MG tablet Take 1 tablet (0.5 mg total) by mouth at bedtime.  . cyclobenzaprine (FLEXERIL) 5 MG tablet TAKE 1 TABLET DAILY AS NEEDED FOR MUSCLE SPASMS  . DULoxetine (CYMBALTA) 60 MG capsule Take 1 capsule (60 mg total) by  mouth daily.  Marland Kitchen lisinopril (PRINIVIL,ZESTRIL) 40 MG tablet Take 1 tablet (40 mg total) by mouth daily.  . meloxicam (MOBIC) 15 MG tablet Take 1 tablet (15 mg total) by mouth daily.  . Multiple Vitamins-Minerals (MULTIVITAMIN WITH MINERALS) tablet Take 1 tablet by mouth daily.  Marland Kitchen OVER THE COUNTER MEDICATION Karton 6 mg daily  . pantoprazole (PROTONIX) 40 MG tablet TAKE 1 TABLET DAILY  . traMADol (ULTRAM) 50 MG tablet Take 1 tablet (50 mg total) by mouth 4 (four) times  daily.  Marland Kitchen triamterene-hydrochlorothiazide (MAXZIDE) 75-50 MG tablet TAKE 1 TABLET DAILY    PHQ 2/9 Scores 01/19/2018 12/15/2017 10/03/2016 10/03/2015  PHQ - 2 Score 0 5 0 0  PHQ- 9 Score 4 20 - -    Physical Exam Vitals signs and nursing note reviewed.  Constitutional:      General: He is not in acute distress.    Appearance: He is well-developed.  HENT:     Head: Normocephalic and atraumatic.  Eyes:     Pupils: Pupils are equal, round, and reactive to light.  Neck:     Musculoskeletal: Normal range of motion and neck supple.  Cardiovascular:     Rate and Rhythm: Normal rate and regular rhythm.     Pulses: Normal pulses.  Pulmonary:     Effort: Pulmonary effort is normal. No respiratory distress.  Musculoskeletal: Normal range of motion.     Comments: Fullness behind right knee - no leg edema, warmth or redness   Skin:    General: Skin is warm and dry.     Findings: No rash.  Neurological:     Mental Status: He is alert and oriented to person, place, and time.  Psychiatric:        Attention and Perception: Attention normal.        Behavior: Behavior normal.        Thought Content: Thought content normal.    Wt Readings from Last 3 Encounters:  01/19/18 157 lb (71.2 kg)  12/15/17 161 lb (73 kg)  08/29/17 164 lb (74.4 kg)    BP 98/68 (BP Location: Right Arm, Patient Position: Sitting, Cuff Size: Normal)   Pulse (!) 108   Ht 5\' 9"  (1.753 m)   Wt 157 lb (71.2 kg)   SpO2 96%   BMI 23.18 kg/m   Assessment and Plan: 1. Moderate episode of recurrent major depressive disorder (HCC) Doing much better on cymbalta but with weight loss Encouraged pt to schedule meals Follow up in 6 weeks  2. Restless leg syndrome Refilled meds - clonazePAM (KLONOPIN) 0.5 MG tablet; Take 1 tablet (0.5 mg total) by mouth at bedtime.  Dispense: 90 tablet; Refill: 1  3. Essential hypertension controlled  4. Baker's cyst of knee, right Pt reassured; if sx worsen would get [place], otherwise  treat conservatively with rest, heat and elevation   Partially dictated using Editor, commissioning. Any errors are unintentional.  Halina Maidens, MD [facility]  01/19/2018

## 2018-02-07 ENCOUNTER — Other Ambulatory Visit: Payer: Self-pay | Admitting: Internal Medicine

## 2018-02-07 DIAGNOSIS — F331 Major depressive disorder, recurrent, moderate: Secondary | ICD-10-CM

## 2018-02-24 ENCOUNTER — Encounter: Payer: Self-pay | Admitting: Internal Medicine

## 2018-02-24 ENCOUNTER — Ambulatory Visit (INDEPENDENT_AMBULATORY_CARE_PROVIDER_SITE_OTHER): Admitting: Internal Medicine

## 2018-02-24 ENCOUNTER — Other Ambulatory Visit: Payer: Self-pay

## 2018-02-24 VITALS — BP 126/76 | HR 94 | Ht 69.0 in | Wt 161.0 lb

## 2018-02-24 DIAGNOSIS — M48061 Spinal stenosis, lumbar region without neurogenic claudication: Secondary | ICD-10-CM

## 2018-02-24 DIAGNOSIS — F331 Major depressive disorder, recurrent, moderate: Secondary | ICD-10-CM | POA: Diagnosis not present

## 2018-02-24 DIAGNOSIS — M2391 Unspecified internal derangement of right knee: Secondary | ICD-10-CM | POA: Diagnosis not present

## 2018-02-24 DIAGNOSIS — I10 Essential (primary) hypertension: Secondary | ICD-10-CM

## 2018-02-24 NOTE — Progress Notes (Signed)
Date:  02/24/2018   Name:  Robert Gilmore   DOB:  1960/12/21   MRN:  [id number]   Chief Complaint: Hypertension (6 month follow up.); Back Pain (Pain from sciatica is now radiating down both legs and not just one. Getting much worse. ); and Knee Pain (Right knee pain - happened last year. At a 8 today. Seen [facility] last year and [facility] in [place]. Recieved a shot in knee 2 weeks ago. Swelling has gone down. Was told if starts to hurt to go back to PCP. ) Hand pain was seen for last visit and sent to Ortho.  He is better after a cortisone injection. Knee strain - seen by Ortho and got a cortisone injection.   The swelling is gone now but still hurts.  He was told to see me and get a referral for possible surgery if pain continued after the swelling resolved.  He has had an MRI at [place] so would refer there.  Hypertension  This is a chronic problem. The problem is controlled. Pertinent negatives include no chest pain, headaches, palpitations or shortness of breath. Past treatments include ACE inhibitors and diuretics. The current treatment provides significant improvement.  Gastroesophageal Reflux  He complains of heartburn. He reports no abdominal pain, no chest pain, no coughing or no wheezing. This is a chronic problem. The problem occurs occasionally. Pertinent negatives include no fatigue. He has tried a PPI for the symptoms. The treatment provided significant relief.  Depression         This is a chronic problem.  Associated symptoms include no fatigue and no headaches.  Past treatments include SNRIs - Serotonin and norepinephrine reuptake inhibitors. Sciatica/low back pain - having more issues now with his back pain and sciatica.  He continues on pain medications from the pain clinic.  He is s/p ESI x 6 in 2016.  He had one nerve block but it was not effective. He is taking Mobic and Tramadol is being tapered.  He would like to see a Neurologist.  He is having more issues  with ADLs, like laundry, cooking, etc.  Review of Systems  Constitutional: Negative for chills, fatigue, fever and unexpected weight change.  Respiratory: Negative for cough, chest tightness, shortness of breath and wheezing.   Cardiovascular: Negative for chest pain, palpitations and leg swelling.  Gastrointestinal: Positive for heartburn. Negative for abdominal distention and abdominal pain.  Musculoskeletal: Positive for arthralgias, back pain and gait problem.  Neurological: Negative for dizziness, light-headedness and headaches.  Psychiatric/Behavioral: Positive for depression. Negative for dysphoric mood and sleep disturbance. The patient is not nervous/anxious.     Patient Active Problem List   Diagnosis Date Noted  . Baker's cyst of knee, right 01/19/2018  . Abdominal wall pain 12/15/2017  . Mucous cyst of digit of right hand 12/15/2017  . Moderate episode of recurrent major depressive disorder ([place]) 12/15/2017  . Chronic pain of left knee 10/03/2016  . Restless leg syndrome 10/03/2015  . Rib pain on right side 10/03/2015  . Essential hypertension 09/05/2015  . Spinal stenosis at L4-L5 level 09/05/2015  . Family history of colon cancer 09/05/2015  . Gastroesophageal reflux disease without esophagitis 09/05/2015  . Migraine without aura and without status migrainosus, not intractable 09/05/2015    No Known Allergies  Past Surgical History:  Procedure Laterality Date  . colonoscopy  2012  . INGUINAL HERNIA REPAIR Bilateral 2015    Social History   Tobacco Use  . Smoking status:  Former Smoker  . Smokeless tobacco: Never Used  Substance Use Topics  . Alcohol use: No    Alcohol/week: 1.0 standard drinks    Types: 1 Cans of beer per week  . Drug use: No     Medication list has been reviewed and updated.  Current Meds  Medication Sig  . clonazePAM (KLONOPIN) 0.5 MG tablet Take 1 tablet (0.5 mg total) by mouth at bedtime.  . cyclobenzaprine (FLEXERIL) 5 MG tablet  TAKE 1 TABLET DAILY AS NEEDED FOR MUSCLE SPASMS  . DULoxetine (CYMBALTA) 60 MG capsule TAKE 1 CAPSULE(60 MG) BY MOUTH DAILY  . lisinopril (PRINIVIL,ZESTRIL) 40 MG tablet Take 1 tablet (40 mg total) by mouth daily.  . meloxicam (MOBIC) 15 MG tablet Take 1 tablet (15 mg total) by mouth daily.  . Multiple Vitamins-Minerals (MULTIVITAMIN WITH MINERALS) tablet Take 1 tablet by mouth daily.  Marland Kitchen OVER THE COUNTER MEDICATION Karton 6 mg daily  . pantoprazole (PROTONIX) 40 MG tablet TAKE 1 TABLET DAILY  . traMADol (ULTRAM) 50 MG tablet Take 1 tablet (50 mg total) by mouth 4 (four) times daily.  Marland Kitchen triamterene-hydrochlorothiazide (MAXZIDE) 75-50 MG tablet TAKE 1 TABLET DAILY    PHQ 2/9 Scores 02/24/2018 01/19/2018 12/15/2017 10/03/2016  PHQ - 2 Score 0 0 5 0  PHQ- 9 Score 9 4 20  -    Physical Exam Vitals signs and nursing note reviewed.  Constitutional:      General: He is not in acute distress.    Appearance: Normal appearance. He is well-developed.  HENT:     Head: Normocephalic and atraumatic.  Eyes:     Pupils: Pupils are equal, round, and reactive to light.  Neck:     Musculoskeletal: Normal range of motion and neck supple.  Cardiovascular:     Rate and Rhythm: Normal rate and regular rhythm.     Pulses: Normal pulses.  Pulmonary:     Effort: Pulmonary effort is normal. No respiratory distress.     Breath sounds: Normal breath sounds.  Musculoskeletal:     Right knee: He exhibits decreased range of motion. He exhibits no swelling and no effusion.     Left knee: He exhibits normal range of motion, no swelling and no effusion.     Lumbar back: He exhibits no bony tenderness.       Back:       Arms:     Comments: SLR positive on right; strength 3+/5 hip flexors No foot drop  Lymphadenopathy:     Cervical: No cervical adenopathy.  Skin:    General: Skin is warm and dry.     Findings: No rash.  Neurological:     Mental Status: He is alert and oriented to person, place, and time.    Psychiatric:        Mood and Affect: Mood is depressed.        Behavior: Behavior normal.        Thought Content: Thought content normal. Thought content does not include suicidal ideation. Thought content does not include suicidal plan.     BP 126/76   Pulse 94   Ht 5\' 9"  (1.753 m)   Wt 161 lb (73 kg)   SpO2 97%   BMI 23.78 kg/m   Assessment and Plan: 1. Essential hypertension Controlled, continue current therapy  2. Knee derangement syndrome, right Continue mobic - Ambulatory referral to Orthopedic Surgery  3. Spinal stenosis at L4-L5 level Worsening sx - recommend Neurosurgery consult (pt will call back with  location of preference)  4. Moderate episode of recurrent major depressive disorder ([place]) Continue Cymbalta and PRN clonazepam   Partially dictated using Editor, commissioning. Any errors are unintentional.  Halina Maidens, MD [facility]  02/24/2018

## 2018-03-02 ENCOUNTER — Ambulatory Visit: Admitting: Internal Medicine

## 2018-03-20 ENCOUNTER — Other Ambulatory Visit: Payer: Self-pay | Admitting: Unknown Physician Specialty

## 2018-03-20 DIAGNOSIS — M25561 Pain in right knee: Secondary | ICD-10-CM

## 2018-03-23 ENCOUNTER — Other Ambulatory Visit: Payer: Self-pay | Admitting: Unknown Physician Specialty

## 2018-04-20 ENCOUNTER — Other Ambulatory Visit: Payer: Self-pay | Admitting: Internal Medicine

## 2018-04-20 DIAGNOSIS — F331 Major depressive disorder, recurrent, moderate: Secondary | ICD-10-CM

## 2018-04-30 ENCOUNTER — Ambulatory Visit

## 2018-06-17 ENCOUNTER — Other Ambulatory Visit: Payer: Self-pay | Admitting: Internal Medicine

## 2018-06-17 ENCOUNTER — Encounter: Payer: Self-pay | Admitting: Internal Medicine

## 2018-06-17 DIAGNOSIS — G2581 Restless legs syndrome: Secondary | ICD-10-CM

## 2018-06-17 MED ORDER — CLONAZEPAM 0.5 MG PO TABS: 0.5000 mg | ORAL_TABLET | Freq: Every day | ORAL | 0 refills | Status: DC

## 2018-06-21 ENCOUNTER — Other Ambulatory Visit: Payer: Self-pay | Admitting: Internal Medicine

## 2018-06-21 DIAGNOSIS — F331 Major depressive disorder, recurrent, moderate: Secondary | ICD-10-CM

## 2018-07-11 ENCOUNTER — Other Ambulatory Visit: Payer: Self-pay | Admitting: Internal Medicine

## 2018-07-11 DIAGNOSIS — M48061 Spinal stenosis, lumbar region without neurogenic claudication: Secondary | ICD-10-CM

## 2018-09-17 ENCOUNTER — Other Ambulatory Visit: Payer: Self-pay | Admitting: Internal Medicine

## 2018-09-17 DIAGNOSIS — M48061 Spinal stenosis, lumbar region without neurogenic claudication: Secondary | ICD-10-CM

## 2018-09-18 ENCOUNTER — Other Ambulatory Visit: Payer: Self-pay | Admitting: Internal Medicine

## 2018-09-18 DIAGNOSIS — M48061 Spinal stenosis, lumbar region without neurogenic claudication: Secondary | ICD-10-CM

## 2018-09-26 ENCOUNTER — Other Ambulatory Visit: Payer: Self-pay | Admitting: Internal Medicine

## 2018-09-26 DIAGNOSIS — K219 Gastro-esophageal reflux disease without esophagitis: Secondary | ICD-10-CM

## 2018-10-11 ENCOUNTER — Telehealth

## 2018-10-23 ENCOUNTER — Other Ambulatory Visit: Payer: Self-pay

## 2018-10-23 ENCOUNTER — Ambulatory Visit (INDEPENDENT_AMBULATORY_CARE_PROVIDER_SITE_OTHER): Admitting: Internal Medicine

## 2018-10-23 ENCOUNTER — Encounter: Payer: Self-pay | Admitting: Internal Medicine

## 2018-10-23 VITALS — BP 128/88 | HR 78 | Ht 69.0 in | Wt 159.0 lb

## 2018-10-23 DIAGNOSIS — F331 Major depressive disorder, recurrent, moderate: Secondary | ICD-10-CM | POA: Diagnosis not present

## 2018-10-23 DIAGNOSIS — G2581 Restless legs syndrome: Secondary | ICD-10-CM

## 2018-10-23 DIAGNOSIS — R1011 Right upper quadrant pain: Secondary | ICD-10-CM

## 2018-10-23 MED ORDER — CLONAZEPAM 0.5 MG PO TABS: 0.5000 mg | ORAL_TABLET | Freq: Every day | ORAL | 0 refills | Status: DC

## 2018-10-23 MED ORDER — DULOXETINE HCL 60 MG PO CPEP: ORAL_CAPSULE | ORAL | 5 refills | Status: DC

## 2018-10-23 NOTE — Progress Notes (Signed)
Date:  10/23/2018   Name:  Robert Gilmore   DOB:  1960/02/20   MRN:  [id number]   Chief Complaint: Hernia (2016 had inguinal hernia repair left and right side. 4 months ago starting having burning sensation where staples were. Hurts more if eating a big meal. )  Abdominal Pain This is a new problem. Episode onset: about 6 months ago. The problem occurs every several days. The problem has been gradually worsening. The pain is located in the RUQ. The pain is mild. The quality of the pain is cramping and a sensation of fullness. The abdominal pain does not radiate. Associated symptoms include belching, constipation and weight loss (due to smaller meals). Pertinent negatives include no dysuria, frequency, nausea or vomiting. The pain is aggravated by eating. Relieved by: low fat diet.    Review of Systems  Constitutional: Positive for weight loss (due to smaller meals).  Respiratory: Negative for cough, chest tightness and shortness of breath.   Cardiovascular: Negative for chest pain and palpitations.  Gastrointestinal: Positive for abdominal pain and constipation. Negative for nausea and vomiting.  Genitourinary: Negative for dysuria, frequency and testicular pain.  Musculoskeletal: Positive for back pain.  Skin: Negative for rash.  Psychiatric/Behavioral: Positive for dysphoric mood and sleep disturbance (RLS). Negative for suicidal ideas. The patient is not nervous/anxious.     Patient Active Problem List   Diagnosis Date Noted  . Baker's cyst of knee, right 01/19/2018  . Abdominal wall pain 12/15/2017  . Mucous cyst of digit of right hand 12/15/2017  . Moderate episode of recurrent major depressive disorder ([place]) 12/15/2017  . Chronic pain of left knee 10/03/2016  . Restless leg syndrome 10/03/2015  . Rib pain on right side 10/03/2015  . Essential hypertension 09/05/2015  . Spinal stenosis at L4-L5 level 09/05/2015  . Family history of colon cancer 09/05/2015  . Gastroesophageal  reflux disease without esophagitis 09/05/2015  . Migraine without aura and without status migrainosus, not intractable 09/05/2015    No Known Allergies  Past Surgical History:  Procedure Laterality Date  . colonoscopy  2012  . INGUINAL HERNIA REPAIR Bilateral 2015    Social History   Tobacco Use  . Smoking status: Former Research scientist (life sciences)  . Smokeless tobacco: Never Used  Substance Use Topics  . Alcohol use: No    Alcohol/week: 1.0 standard drinks    Types: 1 Cans of beer per week  . Drug use: No     Medication list has been reviewed and updated.  Current Meds  Medication Sig  . Buprenorphine HCl (BELBUCA) 150 MCG FILM Place 1 Film inside cheek 2 (two) times daily.  . clonazePAM (KLONOPIN) 0.5 MG tablet Take 1 tablet (0.5 mg total) by mouth at bedtime.  . cyclobenzaprine (FLEXERIL) 5 MG tablet TAKE 1 TABLET DAILY AS NEEDED FOR MUSCLE SPASMS  . DULoxetine (CYMBALTA) 60 MG capsule TAKE 1 CAPSULE(60 MG) BY MOUTH DAILY  . lisinopril (PRINIVIL,ZESTRIL) 40 MG tablet Take 1 tablet (40 mg total) by mouth daily.  . meloxicam (MOBIC) 15 MG tablet TAKE 1 TABLET(15 MG) BY MOUTH DAILY  . Multiple Vitamins-Minerals (MULTIVITAMIN WITH MINERALS) tablet Take 1 tablet by mouth daily.  Marland Kitchen OVER THE COUNTER MEDICATION Karton 6 mg daily  . pantoprazole (PROTONIX) 40 MG tablet TAKE 1 TABLET(40 MG) BY MOUTH TWICE DAILY  . traMADol (ULTRAM) 50 MG tablet Take 1 tablet (50 mg total) by mouth 4 (four) times daily.  Marland Kitchen triamterene-hydrochlorothiazide (MAXZIDE) 75-50 MG tablet TAKE 1 TABLET DAILY  PHQ 2/9 Scores 10/23/2018 02/24/2018 01/19/2018 12/15/2017  PHQ - 2 Score 4 0 0 5  PHQ- 9 Score 9 9 4 20     BP Readings from Last 3 Encounters:  10/23/18 (!) 128/94  02/24/18 126/76  01/19/18 98/68    Physical Exam Vitals signs and nursing note reviewed.  Constitutional:      General: He is not in acute distress.    Appearance: Normal appearance. He is well-developed.  HENT:     Head: Normocephalic and  atraumatic.  Neck:     Musculoskeletal: Normal range of motion.  Cardiovascular:     Rate and Rhythm: Normal rate and regular rhythm.     Pulses: Normal pulses.          Radial pulses are 2+ on the right side and 2+ on the left side.       Femoral pulses are 2+ on the right side and 2+ on the left side.    Heart sounds: No murmur.  Pulmonary:     Effort: Pulmonary effort is normal. No respiratory distress.     Breath sounds: No wheezing or rhonchi.  Abdominal:     General: Abdomen is flat.     Palpations: Abdomen is soft.     Tenderness: There is abdominal tenderness in the right upper quadrant. There is no right CVA tenderness, left CVA tenderness, guarding or rebound. Negative signs include Murphy's sign.     Hernia: No hernia is present.  Musculoskeletal: Normal range of motion.  Skin:    General: Skin is warm and dry.     Findings: No rash.  Neurological:     Mental Status: He is alert and oriented to person, place, and time.  Psychiatric:        Behavior: Behavior normal.        Thought Content: Thought content normal.     Wt Readings from Last 3 Encounters:  10/23/18 159 lb (72.1 kg)  02/24/18 161 lb (73 kg)  01/19/18 157 lb (71.2 kg)    BP (!) 128/94   Pulse 78   Ht 5\' 9"  (1.753 m)   Wt 159 lb (72.1 kg)   SpO2 98%   BMI 23.48 kg/m   Assessment and Plan: 1. Right upper quadrant abdominal pain Low fat diet, go to ER if worsening for suspected gall bladder disease - US Abdomen Complete; Future  2. Moderate episode of recurrent major depressive disorder (HCC) Continue current medications - DULoxetine (CYMBALTA) 60 MG capsule; TAKE 1 CAPSULE(60 MG) BY MOUTH DAILY  Dispense: 30 capsule; Refill: 5  3. Restless leg syndrome Stable on nightly clonazepam - clonazePAM (KLONOPIN) 0.5 MG tablet; Take 1 tablet (0.5 mg total) by mouth at bedtime.  Dispense: 90 tablet; Refill: 0   Partially dictated using Editor, commissioning. Any errors are unintentional.  Halina Maidens, MD [facility]  10/23/2018

## 2018-10-29 ENCOUNTER — Ambulatory Visit

## 2018-10-30 ENCOUNTER — Encounter: Payer: Self-pay | Admitting: Internal Medicine

## 2018-10-30 ENCOUNTER — Other Ambulatory Visit: Payer: Self-pay

## 2018-10-30 ENCOUNTER — Ambulatory Visit
Admission: RE | Admit: 2018-10-30 | Discharge: 2018-10-30 | Disposition: A | Discharge status: Home / Self Care | Source: Ambulatory Visit | Attending: Internal Medicine | Admitting: Internal Medicine

## 2018-10-30 DIAGNOSIS — R1011 Right upper quadrant pain: Secondary | ICD-10-CM | POA: Diagnosis present

## 2018-10-30 NOTE — Telephone Encounter (Signed)
Please advise patient message about [place].

## 2019-02-08 ENCOUNTER — Other Ambulatory Visit: Payer: Self-pay | Admitting: Internal Medicine

## 2019-02-08 ENCOUNTER — Encounter: Payer: Self-pay | Admitting: Internal Medicine

## 2019-02-08 DIAGNOSIS — M48061 Spinal stenosis, lumbar region without neurogenic claudication: Secondary | ICD-10-CM

## 2019-02-08 DIAGNOSIS — G2581 Restless legs syndrome: Secondary | ICD-10-CM

## 2019-02-08 MED ORDER — CLONAZEPAM 0.5 MG PO TABS: 0.5000 mg | ORAL_TABLET | Freq: Every day | ORAL | 0 refills | Status: DC

## 2019-02-08 MED ORDER — CYCLOBENZAPRINE HCL 5 MG PO TABS: 5.0000 mg | ORAL_TABLET | Freq: Every day | ORAL | 1 refills | Status: AC | PRN

## 2019-02-08 NOTE — Telephone Encounter (Signed)
Patient requesting refill on Flexeril.

## 2019-03-13 ENCOUNTER — Other Ambulatory Visit: Payer: Self-pay | Admitting: Internal Medicine

## 2019-03-13 DIAGNOSIS — M48061 Spinal stenosis, lumbar region without neurogenic claudication: Secondary | ICD-10-CM

## 2019-04-18 ENCOUNTER — Other Ambulatory Visit: Payer: Self-pay | Admitting: Internal Medicine

## 2019-05-20 ENCOUNTER — Other Ambulatory Visit: Payer: Self-pay | Admitting: Internal Medicine

## 2019-05-20 DIAGNOSIS — F331 Major depressive disorder, recurrent, moderate: Secondary | ICD-10-CM

## 2019-05-20 NOTE — Telephone Encounter (Signed)
Requested Prescriptions  Pending Prescriptions Disp Refills  . DULoxetine (CYMBALTA) 60 MG capsule [Pharmacy Med Name: DULOXETINE DR 60MG  CAPSULES] 30 capsule 0    Sig: TAKE 1 CAPSULE(60 MG) BY MOUTH DAILY     Psychiatry: Antidepressants - SNRI Failed - 05/20/2019  5:47 PM      Failed - Valid encounter within last 6 months    Recent Outpatient Visits          6 months ago Right upper quadrant abdominal pain   [facility]an [facility], [facility]   1 year ago Essential hypertension   [facility] Glean Hess, MD   1 year ago Moderate episode of recurrent major depressive disorder [facility])   [facility] Glean Hess, [facility]   1 year ago Moderate episode of recurrent major depressive disorder [facility])   [facility] Glean Hess, MD   1 year ago Essential hypertension   [place], MD             Passed - Completed PHQ-2 or PHQ-9 in the last 360 days.      Passed - Last BP in normal range    BP Readings from Last 1 Encounters:  10/23/18 128/88         Patient give Courtesy refill. OV due. Called to schedule appointment but unable to leave message  No answer.

## 2019-06-12 ENCOUNTER — Telehealth: Payer: Self-pay | Admitting: Internal Medicine

## 2019-06-12 DIAGNOSIS — G2581 Restless legs syndrome: Secondary | ICD-10-CM

## 2019-06-12 NOTE — Telephone Encounter (Signed)
Requested medication (s) are due for refill today: yes  Requested medication (s) are on the active medication list: yes  Last refill:  02/08/19   Future visit scheduled: no  Notes to clinic:  Prescription exp: 05/09/19 called pt and LM on VM to call for appt   Requested Prescriptions  Pending Prescriptions Disp Refills   clonazePAM (KLONOPIN) 0.5 MG tablet [Pharmacy Med Name: CLONAZEPAM 0.5MG  TABLETS] 90 tablet     Sig: TAKE 1 TABLET(0.5 MG) BY MOUTH AT BEDTIME      Not Delegated - Psychiatry:  Anxiolytics/Hypnotics Failed - 06/12/2019 10:19 AM      Failed - This refill cannot be delegated      Failed - Urine Drug Screen completed in last 360 days.      Failed - Valid encounter within last 6 months    Recent Outpatient Visits           7 months ago Right upper quadrant abdominal pain   [facility] Glean Hess, [facility]   1 year ago Essential hypertension   [facility] Glean Hess, MD   1 year ago Moderate episode of recurrent major depressive disorder [facility])   [facility] Glean Hess, [facility]   1 year ago Moderate episode of recurrent major depressive disorder [facility])   [facility] Glean Hess, [facility]   1 year ago Essential hypertension   [facility] Glean Hess, [facility]

## 2019-06-15 NOTE — Telephone Encounter (Signed)
Tried calling patient but could not LM

## 2019-06-20 ENCOUNTER — Other Ambulatory Visit: Payer: Self-pay | Admitting: Internal Medicine

## 2019-06-20 DIAGNOSIS — F331 Major depressive disorder, recurrent, moderate: Secondary | ICD-10-CM

## 2019-07-01 ENCOUNTER — Telehealth: Payer: Self-pay | Admitting: Internal Medicine

## 2019-07-01 NOTE — Telephone Encounter (Signed)
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 Patient had last minute eye problem. And is need an authorization. Please advise Robert Gilmore Cb- [phone]

## 2019-07-01 NOTE — Telephone Encounter (Signed)
Lattie Haw with [facility] called to request a referral for patient to be seen.  Please advise and call as soon as possible to confirm.  CB# [phone] or [phone]

## 2019-07-02 ENCOUNTER — Telehealth: Payer: Self-pay

## 2019-07-02 ENCOUNTER — Other Ambulatory Visit: Payer: Self-pay

## 2019-07-02 DIAGNOSIS — H5712 Ocular pain, left eye: Secondary | ICD-10-CM

## 2019-07-02 DIAGNOSIS — H5789 Other specified disorders of eye and adnexa: Secondary | ICD-10-CM

## 2019-07-02 NOTE — Telephone Encounter (Signed)
Called and spoke to Robert Gilmore.   KP

## 2019-07-02 NOTE — Telephone Encounter (Signed)
Pt is requesting RFs and can not get anymore until appt. Please call pt and schedule and appt as soon as possible.  KP

## 2019-07-02 NOTE — Telephone Encounter (Signed)
Sent in referral for pt.  KP

## 2019-07-06 NOTE — Telephone Encounter (Signed)
If you have time can you do this for me I laced the referral but I did not fax it.  Thank you, KP

## 2019-07-06 NOTE — Telephone Encounter (Signed)
See phone note dated 07/01/19 //referral

## 2019-07-06 NOTE — Telephone Encounter (Signed)
Lisa with [facility] calling back because they do not have referral for the pt.  She had asked the referral be faxed to her, and that was never done. It is not in the [employer] , nothing recent at all. Pt is scheduled to come back for follow up today, and they never got the first referral or the one for today.  Fax: [phone] Attn: [facility] npi [phone] [facility]  cb [phone]

## 2019-07-15 ENCOUNTER — Other Ambulatory Visit: Payer: Self-pay | Admitting: Internal Medicine

## 2019-07-15 ENCOUNTER — Encounter: Payer: Self-pay | Admitting: Internal Medicine

## 2019-07-15 DIAGNOSIS — G2581 Restless legs syndrome: Secondary | ICD-10-CM

## 2019-07-15 NOTE — Telephone Encounter (Signed)
Refill request too early. If patient is taking as prescribed he should have enough medication to last until 09/12/19. TC to patient to schedule needed appointment-LOV 10/23/18. Patient stated he would be transferring to a new PCP soon as he has moved to [place]. He will schedule visit and referral via MyChart.  This medication is not delegated for [facility] to refill however I have notified the patient that it is too soon to refill.

## 2019-08-24 ENCOUNTER — Encounter: Payer: Self-pay | Admitting: Internal Medicine

## 2019-08-24 ENCOUNTER — Ambulatory Visit (INDEPENDENT_AMBULATORY_CARE_PROVIDER_SITE_OTHER): Admitting: Internal Medicine

## 2019-08-24 ENCOUNTER — Other Ambulatory Visit: Payer: Self-pay

## 2019-08-24 VITALS — BP 124/84 | HR 83 | Temp 98.1°F | Ht 69.0 in | Wt 171.0 lb

## 2019-08-24 DIAGNOSIS — K219 Gastro-esophageal reflux disease without esophagitis: Secondary | ICD-10-CM | POA: Diagnosis not present

## 2019-08-24 DIAGNOSIS — M48061 Spinal stenosis, lumbar region without neurogenic claudication: Secondary | ICD-10-CM

## 2019-08-24 DIAGNOSIS — F331 Major depressive disorder, recurrent, moderate: Secondary | ICD-10-CM

## 2019-08-24 DIAGNOSIS — I10 Essential (primary) hypertension: Secondary | ICD-10-CM | POA: Diagnosis not present

## 2019-08-24 MED ORDER — PANTOPRAZOLE SODIUM 40 MG PO TBEC: 40.0000 mg | DELAYED_RELEASE_TABLET | Freq: Two times a day (BID) | ORAL | 1 refills | Status: DC

## 2019-08-24 MED ORDER — TRIAMTERENE-HCTZ 75-50 MG PO TABS: 1.0000 | ORAL_TABLET | Freq: Every day | ORAL | 1 refills | Status: DC

## 2019-08-24 NOTE — Progress Notes (Signed)
Date:  08/24/2019   Name:  Robert Gilmore   DOB:  03-29-1960   MRN:  [id number]   Chief Complaint: pain management (needs to update [facility] authorization to be seen by [facility] team), Hypertension (follow up ), and Gastroesophageal Reflux  Hypertension This is a chronic problem. The problem is controlled. Pertinent negatives include no chest pain, headaches, palpitations or shortness of breath. Past treatments include ACE inhibitors and diuretics. The current treatment provides significant improvement. There are no compliance problems.   Gastroesophageal Reflux He complains of heartburn. He reports no chest pain, no coughing or no wheezing. This is a chronic problem. Pertinent negatives include no fatigue. He has tried a PPI for the symptoms.  Back Pain This is a chronic problem. The problem occurs daily. The problem is unchanged. The pain is present in the lumbar spine. The pain is moderate. The symptoms are aggravated by bending, twisting and stress. Pertinent negatives include no chest pain, fever or headaches. Treatments tried: followed by [facility] pain management.    Lab Results  Component Value Date   CREATININE 1.10 12/15/2017   BUN 18 12/15/2017   NA 142 12/15/2017   K 3.7 12/15/2017   CL 96 12/15/2017   CO2 26 12/15/2017   Lab Results  Component Value Date   CHOL 169 10/03/2016   HDL 37 (L) 10/03/2016   LDLCALC 86 10/03/2016   TRIG 230 (H) 10/03/2016   CHOLHDL 4.6 10/03/2016   Lab Results  Component Value Date   TSH 2.160 12/15/2017   Lab Results  Component Value Date   HGBA1C 5.5 10/03/2015   Lab Results  Component Value Date   WBC 7.0 10/03/2016   HGB 14.4 10/03/2016   HCT 41.6 10/03/2016   MCV 86 10/03/2016   PLT 259 10/03/2016   Lab Results  Component Value Date   ALT 33 12/15/2017   AST 16 12/15/2017   ALKPHOS 101 12/15/2017   BILITOT 0.3 12/15/2017     Review of Systems  Constitutional: Negative for chills, fatigue and fever.    Eyes: Negative for visual disturbance.  Respiratory: Negative for cough, chest tightness, shortness of breath and wheezing.   Cardiovascular: Negative for chest pain and palpitations.  Gastrointestinal: Positive for heartburn.  Musculoskeletal: Positive for arthralgias and back pain.  Neurological: Negative for dizziness, light-headedness and headaches.  Psychiatric/Behavioral: Positive for dysphoric mood and sleep disturbance. The patient is nervous/anxious.     Patient Active Problem List   Diagnosis Date Noted  . Baker's cyst of knee, right 01/19/2018  . Abdominal wall pain 12/15/2017  . Mucous cyst of digit of right hand 12/15/2017  . Moderate episode of recurrent major depressive disorder ([place]) 12/15/2017  . Chronic pain of left knee 10/03/2016  . Restless leg syndrome 10/03/2015  . Rib pain on right side 10/03/2015  . Essential hypertension 09/05/2015  . Spinal stenosis at L4-L5 level 09/05/2015  . Family history of colon cancer 09/05/2015  . Gastroesophageal reflux disease without esophagitis 09/05/2015  . Migraine without aura and without status migrainosus, not intractable 09/05/2015    No Known Allergies  Past Surgical History:  Procedure Laterality Date  . colonoscopy  2012  . INGUINAL HERNIA REPAIR Bilateral 2015    Social History   Tobacco Use  . Smoking status: Former Research scientist (life sciences)  . Smokeless tobacco: Never Used  Vaping Use  . Vaping Use: Never used  Substance Use Topics  . Alcohol use: No    Alcohol/week: 1.0 standard drink  Types: 1 Cans of beer per week  . Drug use: No     Medication list has been reviewed and updated.  Current Meds  Medication Sig  . clonazePAM (KLONOPIN) 0.5 MG tablet TAKE 1 TABLET(0.5 MG) BY MOUTH AT BEDTIME  . cyclobenzaprine (FLEXERIL) 5 MG tablet Take 1 tablet (5 mg total) by mouth daily as needed for muscle spasms.  . meloxicam (MOBIC) 15 MG tablet TAKE 1 TABLET(15 MG) BY MOUTH DAILY  . ondansetron (ZOFRAN-ODT) 4 MG  disintegrating tablet   . pantoprazole (PROTONIX) 40 MG tablet TAKE 1 TABLET(40 MG) BY MOUTH TWICE DAILY  . promethazine (PHENERGAN) 25 MG tablet   . traMADol (ULTRAM) 50 MG tablet Take 1 tablet (50 mg total) by mouth 4 (four) times daily.  Marland Kitchen triamterene-hydrochlorothiazide (MAXZIDE) 75-50 MG tablet TAKE 1 TABLET DAILY  . [DISCONTINUED] lisinopril (PRINIVIL,ZESTRIL) 40 MG tablet Take 1 tablet (40 mg total) by mouth daily.    PHQ 2/9 Scores 08/24/2019 10/23/2018 02/24/2018 01/19/2018  PHQ - 2 Score 4 4 0 0  PHQ- 9 Score 11 9 9 4     GAD 7 : Generalized Anxiety Score 08/24/2019  Nervous, Anxious, on Edge 2  Control/stop worrying 1  Worry too much - different things 1  Trouble relaxing 1  Restless 0  Easily annoyed or irritable 1  Afraid - awful might happen 0  Total GAD 7 Score 6  Anxiety Difficulty Somewhat difficult    BP Readings from Last 3 Encounters:  08/24/19 124/84  10/23/18 128/88  02/24/18 126/76    Physical Exam Vitals and nursing note reviewed.  Constitutional:      General: He is not in acute distress.    Appearance: Normal appearance. He is well-developed.  HENT:     Head: Normocephalic and atraumatic.  Cardiovascular:     Rate and Rhythm: Normal rate and regular rhythm.     Pulses: Normal pulses.  Pulmonary:     Effort: Pulmonary effort is normal. No respiratory distress.     Breath sounds: No wheezing or rhonchi.  Musculoskeletal:     Cervical back: Normal range of motion.  Skin:    General: Skin is warm and dry.     Findings: No rash.  Neurological:     General: No focal deficit present.     Mental Status: He is alert and oriented to person, place, and time.  Psychiatric:        Mood and Affect: Mood normal.     Wt Readings from Last 3 Encounters:  08/24/19 171 lb (77.6 kg)  10/23/18 159 lb (72.1 kg)  02/24/18 161 lb (73 kg)    BP 124/84   Pulse 83   Temp 98.1 F (36.7 C) (Oral)   Ht 5\' 9"  (1.753 m)   Wt 171 lb (77.6 kg)   SpO2 97%   BMI  25.25 kg/m   Assessment and Plan: 1. Essential hypertension Clinically stable exam with well controlled BP off of lisinopril on hctz alone. Tolerating medications without side effects at this time. Pt to continue current regimen and low sodium diet; benefits of regular exercise as able discussed. - triamterene-hydrochlorothiazide (MAXZIDE) 75-50 MG tablet; Take 1 tablet by mouth daily.  Dispense: 90 tablet; Refill: 1  2. Gastroesophageal reflux disease without esophagitis Symptoms well controlled on daily PPI No red flag signs such as weight loss, n/v, melena Will continue PPI bid. - pantoprazole (PROTONIX) 40 MG tablet; Take 1 tablet (40 mg total) by mouth 2 (two) times daily.  Dispense: 180 tablet; Refill: 1  3. Moderate episode of recurrent major depressive disorder (HCC) Holding Cymbalta and Clonazepam while trying to get back pain under control since chronic pain affects mood  4. Spinal stenosis at L4-L5 level Long time patient at [facility] pain clinic - needs new [facility] referral updated yearly - Ambulatory referral to Pain Clinic  He is going to move his PCP to [facility] since he is living there now.  He can request records if needed or they can use CareEverywhere.  Partially dictated using Editor, commissioning. Any errors are unintentional.  Halina Maidens, MD [facility]  08/24/2019

## 2019-09-09 ENCOUNTER — Other Ambulatory Visit: Payer: Self-pay | Admitting: Internal Medicine

## 2019-09-09 DIAGNOSIS — G2581 Restless legs syndrome: Secondary | ICD-10-CM

## 2019-09-09 NOTE — Telephone Encounter (Signed)
Please Advise. Last office visit 08/24/2019.  KP

## 2019-09-09 NOTE — Telephone Encounter (Signed)
Requested medication (s) are due for refill today:  Yes  Requested medication (s) are on the active medication list:  Yes  Future visit scheduled:  Yes  Last Refill: 06/15/19; #90; no refills  Notes to clinic: Not delegated.   Requested Prescriptions  Pending Prescriptions Disp Refills   clonazePAM (KLONOPIN) 0.5 MG tablet [Pharmacy Med Name: CLONAZEPAM 0.5MG  TABLETS] 90 tablet     Sig: TAKE 1 TABLET(0.5 MG) BY MOUTH AT BEDTIME      Not Delegated - Psychiatry:  Anxiolytics/Hypnotics Failed - 09/09/2019  3:12 PM      Failed - This refill cannot be delegated      Failed - Urine Drug Screen completed in last 360 days.      Passed - Valid encounter within last 6 months    Recent Outpatient Visits           2 weeks ago Essential hypertension   [facility] Glean Hess, MD   10 months ago Right upper quadrant abdominal pain   [facility] Glean Hess, MD   1 year ago Essential hypertension   [facility] Glean Hess, MD   1 year ago Moderate episode of recurrent major depressive disorder [facility])   [facility] Glean Hess, MD   1 year ago Moderate episode of recurrent major depressive disorder [facility])   [facility] Glean Hess, MD

## 2019-09-14 ENCOUNTER — Encounter: Payer: Self-pay | Admitting: Internal Medicine

## 2019-09-14 ENCOUNTER — Other Ambulatory Visit: Payer: Self-pay | Admitting: Internal Medicine

## 2019-09-14 DIAGNOSIS — I10 Essential (primary) hypertension: Secondary | ICD-10-CM

## 2019-09-14 MED ORDER — LISINOPRIL 40 MG PO TABS: 40.0000 mg | ORAL_TABLET | Freq: Every day | ORAL | 1 refills | Status: AC

## 2019-09-23 ENCOUNTER — Encounter: Payer: Self-pay | Admitting: Internal Medicine

## 2019-09-27 ENCOUNTER — Encounter: Payer: Self-pay | Admitting: Internal Medicine

## 2019-09-27 ENCOUNTER — Other Ambulatory Visit: Payer: Self-pay | Admitting: Internal Medicine

## 2019-09-27 DIAGNOSIS — G2581 Restless legs syndrome: Secondary | ICD-10-CM

## 2019-10-12 ENCOUNTER — Other Ambulatory Visit: Payer: Self-pay | Admitting: Internal Medicine

## 2019-10-12 DIAGNOSIS — M48061 Spinal stenosis, lumbar region without neurogenic claudication: Secondary | ICD-10-CM

## 2019-10-12 NOTE — Telephone Encounter (Signed)
Requested medications are due for refill today? Yes  Requested medications are on active medication list? Yes  Last Refill:   03/13/2019  # 90 with one refill   Future visit scheduled?   No   Notes to Clinic: Medication failed Rx refill protocol due to no labs with the past 360 days.  Last Hgb was performed on 10/03/2016 and last creatinine was performed on 12/15/2017.

## 2019-12-31 IMAGING — US US ABDOMEN COMPLETE
1 series · 14 of 25 positions shown · non-contrast
Comparison: None.

CLINICAL DATA: Right upper quadrant postprandial pain

EXAM:
ABDOMEN ULTRASOUND COMPLETE

[Series 1: us abdomen complete · 0.22mm/px · 14 of 103 slices shown]
[im 1/103]
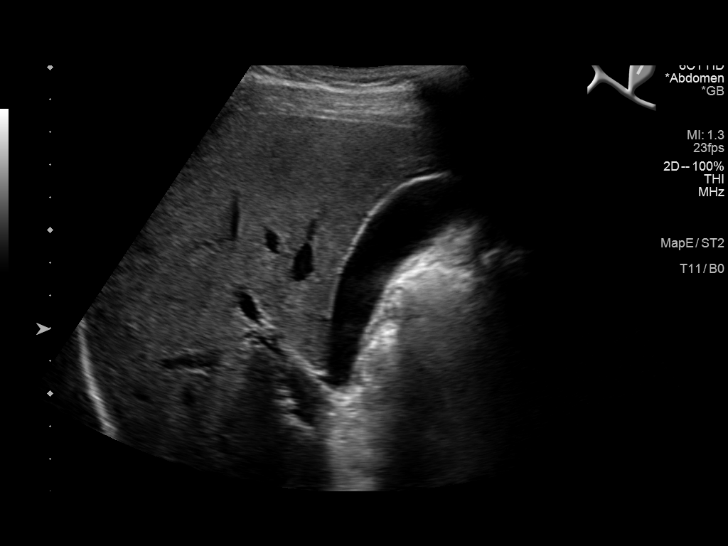
[im 9/103]
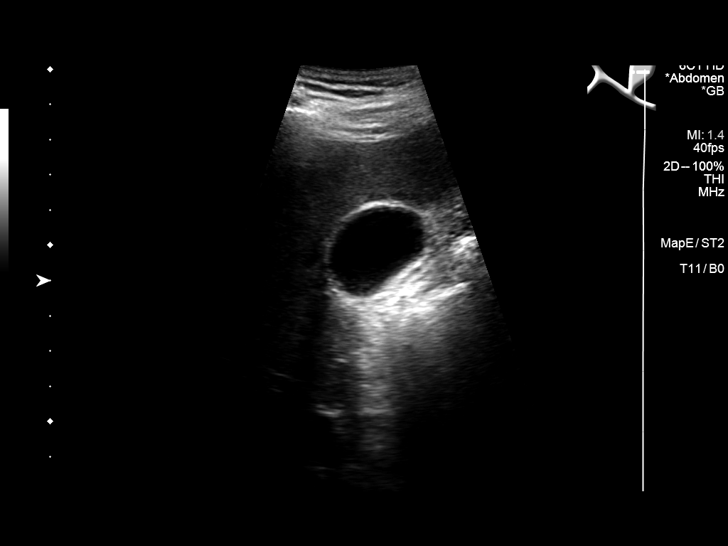
[im 18/103]
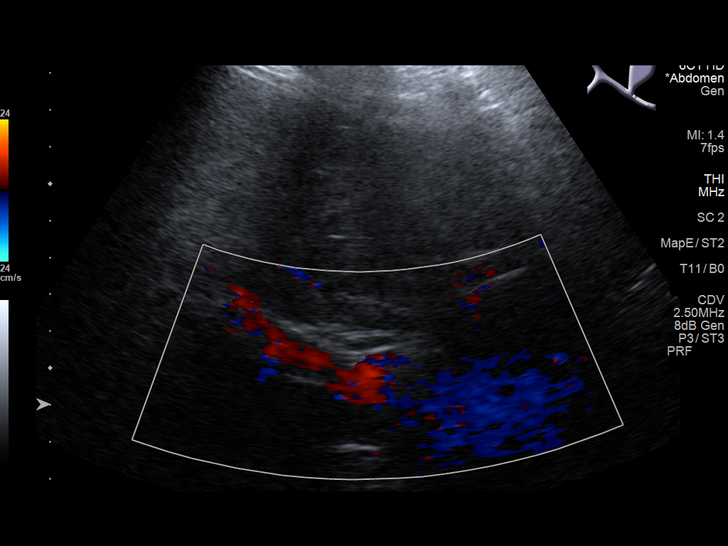
[im 26/103]
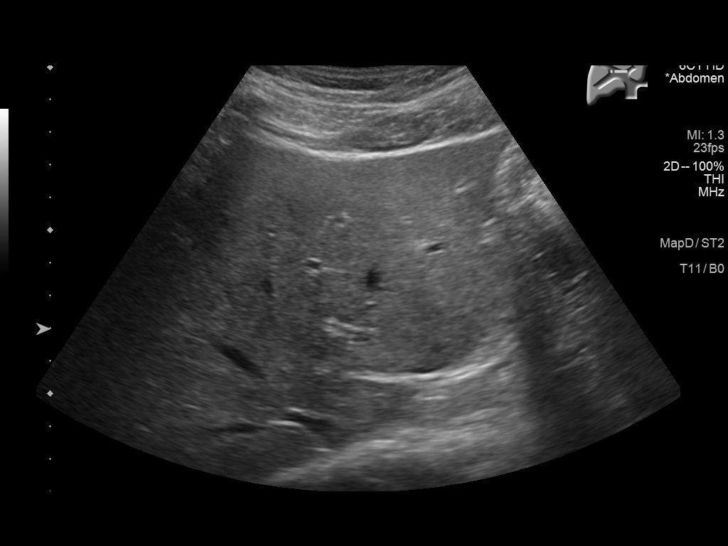
[im 35/103]
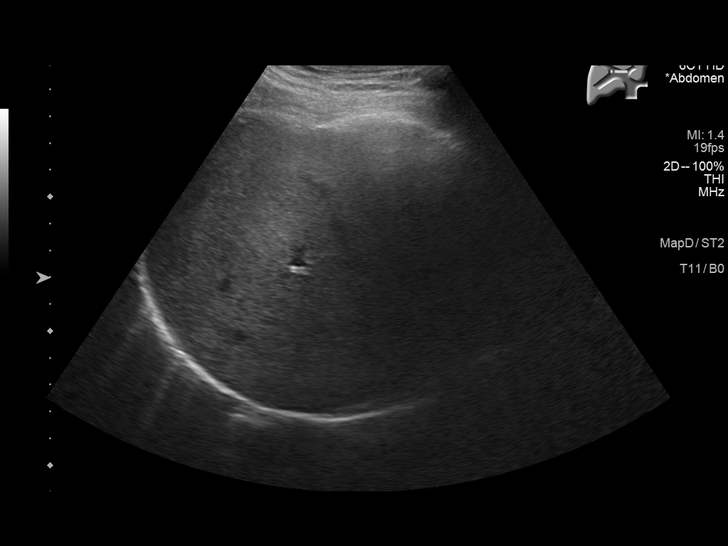
[im 39/103]
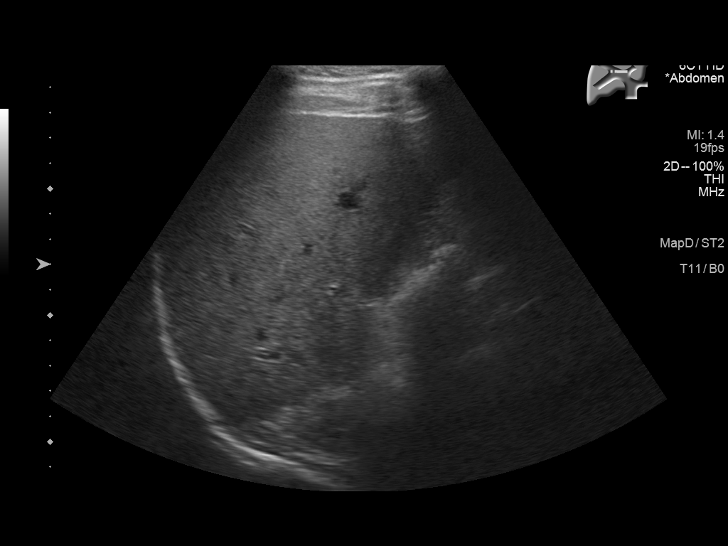
[im 47/103]
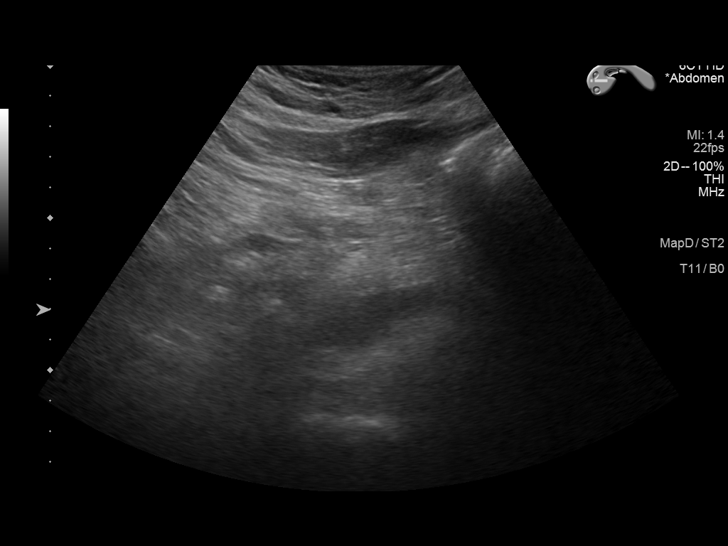
[im 56/103]
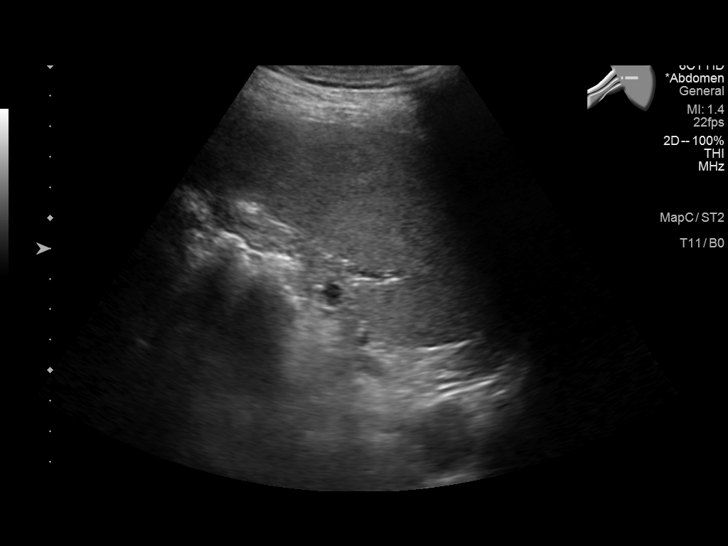
[im 64/103]
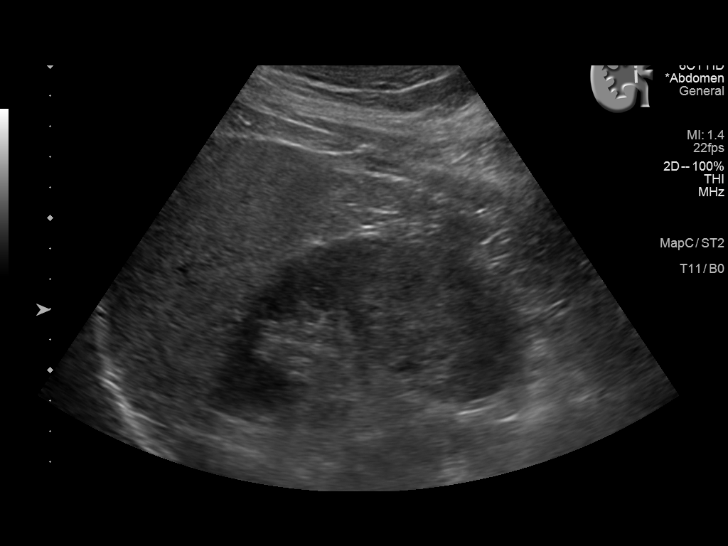
[im 69/103]
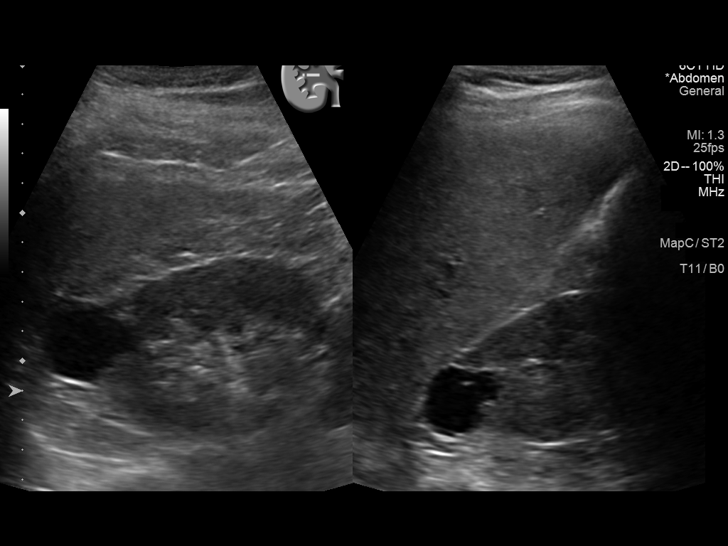
[im 77/103]
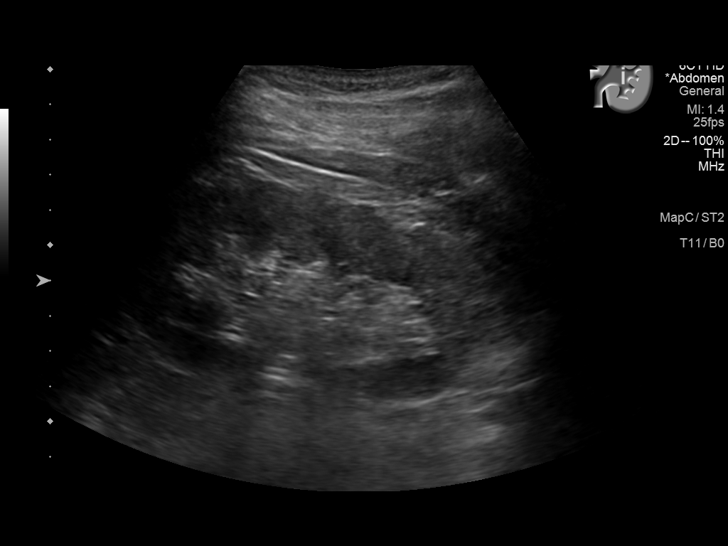
[im 86/103]
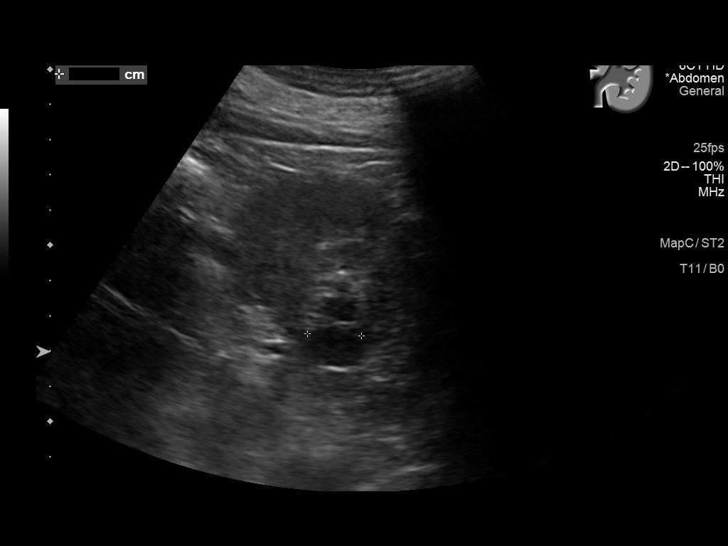
[im 94/103]
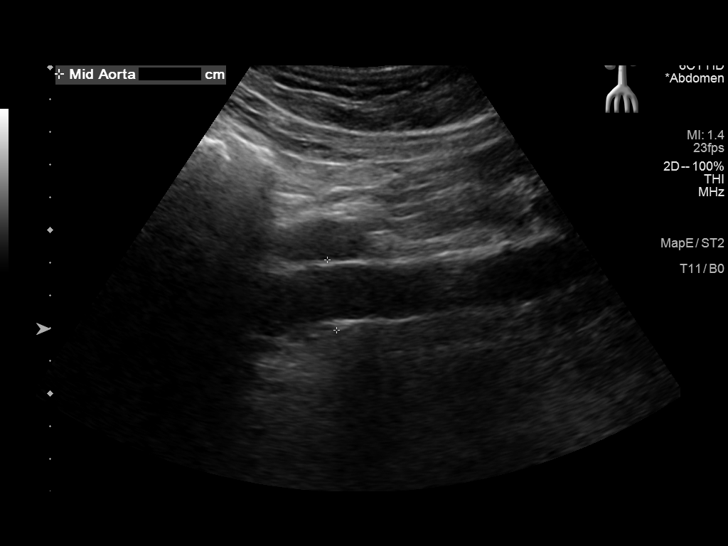
[im 103/103]
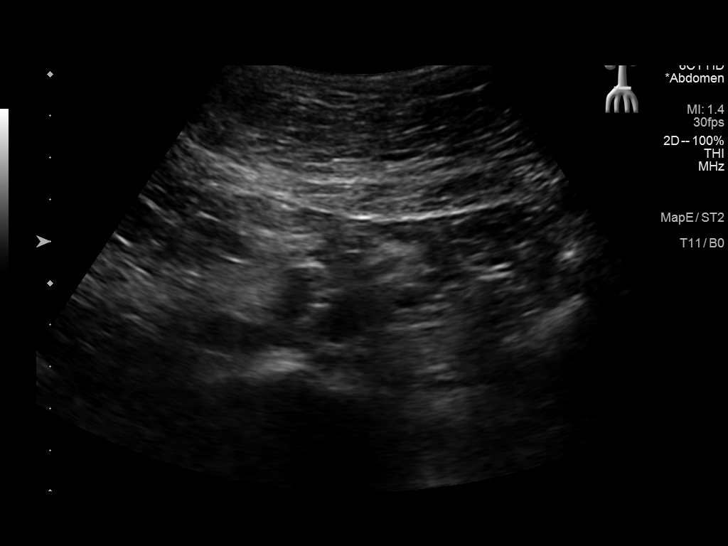

[14 of 25 positions shown; findings below may reference images not displayed]

FINDINGS: Gallbladder: No gallstones or wall thickening visualized. No
sonographic Murphy sign noted by sonographer.

Common bile duct: Diameter: Normal caliber, 4 mm

Liver: Slight increased echotexture throughout the liver suggests
early fatty infiltration. Portal vein is patent on color Doppler
imaging with normal direction of blood flow towards the liver.

IVC: No abnormality visualized.

Pancreas: Visualized portion unremarkable.

Spleen: Size and appearance within normal limits.

Right Kidney: Length: 10.5 cm. Normal echotexture. No
hydronephrosis. 2.8 cm benign-appearing cyst with thin internal
septation in the upper to midpole.

Left Kidney: Length: 10.0 cm. Normal echotexture. No hydronephrosis.
2.0 cm benign-appearing septated cyst in the upper pole.

Abdominal aorta: No aneurysm visualized.

Other findings: None.
IMPRESSION: Suspect early fatty infiltration of the liver.

Benign appearing cysts within the kidneys bilaterally.

No acute findings.

## 2020-02-17 ENCOUNTER — Other Ambulatory Visit: Payer: Self-pay | Admitting: Internal Medicine

## 2020-02-17 DIAGNOSIS — K219 Gastro-esophageal reflux disease without esophagitis: Secondary | ICD-10-CM

## 2020-02-17 DIAGNOSIS — I10 Essential (primary) hypertension: Secondary | ICD-10-CM

## 2020-09-13 ENCOUNTER — Other Ambulatory Visit: Payer: Self-pay | Admitting: Internal Medicine

## 2020-09-13 DIAGNOSIS — I10 Essential (primary) hypertension: Secondary | ICD-10-CM

## 2020-09-13 NOTE — Telephone Encounter (Signed)
Requested medications are due for refill today.  Yes - Ovredue  Requested medications are on the active medications list.  yes  Last refill. 02/17/2020  Future visit scheduled.   no  Notes to clinic.  Failed protocol - Labs overdue, Pt is more than 3 months overdue for OV.
